# Patient Record
Sex: Male | Born: 1945 | Race: Asian | Hispanic: No | Marital: Married | Smoking: Former smoker
Health system: Southern US, Community
[De-identification: ages and names within clinical notes are randomized; demographics above are authoritative.]

## PROBLEM LIST (undated history)

## (undated) DIAGNOSIS — IMO0002 Reserved for concepts with insufficient information to code with codable children: Secondary | ICD-10-CM

## (undated) DIAGNOSIS — R918 Other nonspecific abnormal finding of lung field: Secondary | ICD-10-CM

## (undated) DIAGNOSIS — IMO0001 Reserved for inherently not codable concepts without codable children: Secondary | ICD-10-CM

## (undated) DIAGNOSIS — C3490 Malignant neoplasm of unspecified part of unspecified bronchus or lung: Secondary | ICD-10-CM

## (undated) DIAGNOSIS — Z720 Tobacco use: Secondary | ICD-10-CM

## (undated) DIAGNOSIS — N400 Enlarged prostate without lower urinary tract symptoms: Secondary | ICD-10-CM

## (undated) DIAGNOSIS — I2510 Atherosclerotic heart disease of native coronary artery without angina pectoris: Secondary | ICD-10-CM

## (undated) HISTORY — DX: Tobacco use: Z72.0

## (undated) HISTORY — DX: Reserved for inherently not codable concepts without codable children: IMO0001

## (undated) HISTORY — DX: Benign prostatic hyperplasia without lower urinary tract symptoms: N40.0

## (undated) HISTORY — DX: Other nonspecific abnormal finding of lung field: R91.8

## (undated) HISTORY — DX: Reserved for concepts with insufficient information to code with codable children: IMO0002

## (undated) HISTORY — DX: Atherosclerotic heart disease of native coronary artery without angina pectoris: I25.10

---

## 2009-09-26 ENCOUNTER — Ambulatory Visit: Payer: Self-pay | Admitting: Internal Medicine

## 2009-09-26 DIAGNOSIS — L259 Unspecified contact dermatitis, unspecified cause: Secondary | ICD-10-CM

## 2009-09-26 DIAGNOSIS — N411 Chronic prostatitis: Secondary | ICD-10-CM

## 2009-09-26 LAB — CONVERTED CEMR LAB
Glucose, Urine, Semiquant: NEGATIVE
Ketones, urine, test strip: NEGATIVE
Nitrite: NEGATIVE
Urobilinogen, UA: 0.2

## 2009-10-27 ENCOUNTER — Ambulatory Visit: Payer: Self-pay | Admitting: Internal Medicine

## 2009-10-27 DIAGNOSIS — R3129 Other microscopic hematuria: Secondary | ICD-10-CM | POA: Insufficient documentation

## 2009-10-27 LAB — CONVERTED CEMR LAB
Bilirubin Urine: NEGATIVE
Ketones, urine, test strip: NEGATIVE
Nitrite: NEGATIVE
Protein, U semiquant: NEGATIVE
Urobilinogen, UA: 0.2
WBC Urine, dipstick: NEGATIVE

## 2009-11-04 ENCOUNTER — Encounter: Payer: Self-pay | Admitting: Internal Medicine

## 2010-11-10 NOTE — Assessment & Plan Note (Signed)
Summary: 1 MONTH FOLLOW UP/MHF   Vital Signs:  Patient profile:   65 year old male Weight:      135 pounds BMI:     23.26 O2 Sat:      100 % on Room air Temp:     97.9 degrees F oral Pulse rate:   67 / minute Pulse rhythm:   regular Resp:     16 per minute BP sitting:   130 / 80  (left arm) Cuff size:   regular  Vitals Entered By: Glendell Docker CMA (October 27, 2009 3:24 PM)  O2 Flow:  Room air  Contraindications/Deferment of Procedures/Staging:    Test/Procedure: FLU VAX    Reason for deferment: patient declined   Primary Care Provider:  DThomos Lemons DO  CC:  1 Month Follow up.  History of Present Illness: 1 Month follow up (Hx as per daughter)  65 y/o [place] male for f/u re:  difficulty urinating / prostatitis.  he finished full course of cipro.  he has been taking hytrin.  urine flow slightly better but he c/o persistent dysuria. no fever, no back pain  pt quit smoking  Preventive Screening-Counseling & Management  Alcohol-Tobacco     Smoking Status: quit < 6 months  Allergies: No Known Drug Allergies  Past History:  Past Medical History: Tobacco use    Family History: Family History of Ulcers     Social History: Occupation: Dry Cleaning Married 34 years 1 son 18   1daughter Smoking Status:  quit < 6 months  Physical Exam  General:  alert, well-developed, and well-nourished.   Lungs:  normal respiratory effort and normal breath sounds.   Heart:  normal rate, regular rhythm, and no gallop.   Abdomen:  soft, non-tender, no masses, no hepatomegaly, and no splenomegaly.   Prostate:  1+ enlarged.  non tender   Impression & Recommendations:  Problem # 1:  MICROSCOPIC HEMATURIA (ICD-599.72) 65 y/o [place] male with long hx of tob use with persistent micro hematuria despite 3 weeks of cipro.  refer to urology to rule out bladder lesion.   The following medications were removed from the medication list:    Ciprofloxacin Hcl 500 Mg Tabs  (Ciprofloxacin hcl) ..... One by mouth two times a day His updated medication list for this problem includes:    Doxycycline Hyclate 100 Mg Tabs (Doxycycline hyclate) ..... One by mouth two times a day  Orders: Urology Referral (Urology)  Problem # 2:  CHRONIC PROSTATITIS (ICD-601.1) still having dysuria.  pt finished cipro.  trial of doxy x 2 weeks.  continue hytrin  Complete Medication List: 1)  Terazosin Hcl 1 Mg Caps (Terazosin hcl) .... One by mouth at bedtime 2)  Doxycycline Hyclate 100 Mg Tabs (Doxycycline hyclate) .... One by mouth two times a day  Other Orders: UA Dipstick w/o Micro (manual) ([id number])  Patient Instructions: 1)  Please schedule a follow-up appointment in 2 months. Prescriptions: TERAZOSIN HCL 1 MG CAPS (TERAZOSIN HCL) one by mouth at bedtime  #30 x 3   Entered and Authorized by:   D. Thomos Lemons DO   Signed by:   D. Thomos Lemons DO on 10/27/2009   Method used:   Print then Give to Patient   RxID:   [id number] DOXYCYCLINE HYCLATE 100 MG TABS (DOXYCYCLINE HYCLATE) one by mouth two times a day  #28 x 0   Entered and Authorized by:   D. Thomos Lemons DO   Signed by:   D. Molly Maduro  Yoo DO on 10/27/2009   Method used:   Print then Give to Patient   RxID:   [id number]   Laboratory Results   Urine Tests    Routine Urinalysis   Color: yellow Appearance: Clear Glucose: negative   (Normal Range: Negative) Bilirubin: negative   (Normal Range: Negative) Ketone: negative   (Normal Range: Negative) Spec. Gravity: 1.010   (Normal Range: 1.003-1.035) Blood: moderate   (Normal Range: Negative) pH: 7.0   (Normal Range: 5.0-8.0) Protein: negative   (Normal Range: Negative) Urobilinogen: 0.2   (Normal Range: 0-1) Nitrite: negative   (Normal Range: Negative) Leukocyte Esterace: negative   (Normal Range: Negative)         Immunization History:  Influenza Immunization History:    Influenza:  declined (10/27/2009)

## 2010-11-10 NOTE — Consult Note (Signed)
Summary: [facility]  [facility]   Imported By: Lanelle Bal 11/13/2009 11:35:51  _____________________________________________________________________  External Attachment:    Type:   Image     Comment:   External Document

## 2012-05-16 ENCOUNTER — Ambulatory Visit (HOSPITAL_BASED_OUTPATIENT_CLINIC_OR_DEPARTMENT_OTHER)
Admission: RE | Admit: 2012-05-16 | Discharge: 2012-05-16 | Disposition: A | Discharge status: Home / Self Care | Payer: No Typology Code available for payment source | Source: Ambulatory Visit | Attending: Internal Medicine | Admitting: Internal Medicine

## 2012-05-16 ENCOUNTER — Ambulatory Visit (INDEPENDENT_AMBULATORY_CARE_PROVIDER_SITE_OTHER): Payer: No Typology Code available for payment source | Admitting: Internal Medicine

## 2012-05-16 ENCOUNTER — Encounter: Payer: Self-pay | Admitting: Internal Medicine

## 2012-05-16 VITALS — BP 124/78 | HR 78 | Temp 98.5°F | Resp 16 | Ht 65.0 in | Wt 131.2 lb

## 2012-05-16 DIAGNOSIS — M25512 Pain in left shoulder: Secondary | ICD-10-CM

## 2012-05-16 DIAGNOSIS — Z720 Tobacco use: Secondary | ICD-10-CM

## 2012-05-16 DIAGNOSIS — R222 Localized swelling, mass and lump, trunk: Secondary | ICD-10-CM

## 2012-05-16 DIAGNOSIS — F17200 Nicotine dependence, unspecified, uncomplicated: Secondary | ICD-10-CM

## 2012-05-16 DIAGNOSIS — M25519 Pain in unspecified shoulder: Secondary | ICD-10-CM | POA: Insufficient documentation

## 2012-05-16 DIAGNOSIS — R918 Other nonspecific abnormal finding of lung field: Secondary | ICD-10-CM

## 2012-05-16 MED ORDER — DICLOFENAC SODIUM 75 MG PO TBEC: DELAYED_RELEASE_TABLET | ORAL | Status: DC

## 2012-05-16 MED ORDER — TERAZOSIN HCL 1 MG PO CAPS: 1.0000 mg | ORAL_CAPSULE | Freq: Every day | ORAL | Status: DC

## 2012-05-17 ENCOUNTER — Encounter (HOSPITAL_BASED_OUTPATIENT_CLINIC_OR_DEPARTMENT_OTHER): Payer: Self-pay

## 2012-05-17 ENCOUNTER — Telehealth: Payer: Self-pay | Admitting: Family

## 2012-05-17 ENCOUNTER — Ambulatory Visit (HOSPITAL_BASED_OUTPATIENT_CLINIC_OR_DEPARTMENT_OTHER)
Admission: RE | Admit: 2012-05-17 | Discharge: 2012-05-17 | Disposition: A | Discharge status: Home / Self Care | Payer: No Typology Code available for payment source | Source: Ambulatory Visit | Attending: Internal Medicine | Admitting: Internal Medicine

## 2012-05-17 DIAGNOSIS — I2510 Atherosclerotic heart disease of native coronary artery without angina pectoris: Secondary | ICD-10-CM | POA: Insufficient documentation

## 2012-05-17 DIAGNOSIS — Z87891 Personal history of nicotine dependence: Secondary | ICD-10-CM | POA: Insufficient documentation

## 2012-05-17 DIAGNOSIS — K7689 Other specified diseases of liver: Secondary | ICD-10-CM | POA: Insufficient documentation

## 2012-05-17 DIAGNOSIS — I700 Atherosclerosis of aorta: Secondary | ICD-10-CM | POA: Insufficient documentation

## 2012-05-17 DIAGNOSIS — R222 Localized swelling, mass and lump, trunk: Secondary | ICD-10-CM | POA: Insufficient documentation

## 2012-05-17 DIAGNOSIS — R918 Other nonspecific abnormal finding of lung field: Secondary | ICD-10-CM | POA: Insufficient documentation

## 2012-05-17 DIAGNOSIS — J984 Other disorders of lung: Secondary | ICD-10-CM | POA: Insufficient documentation

## 2012-05-17 LAB — CBC WITH DIFFERENTIAL/PLATELET
Basophils Absolute: 0.1 10*3/uL (ref 0.0–0.1)
Lymphocytes Relative: 28 % (ref 12–46)
Lymphs Abs: 2.7 10*3/uL (ref 0.7–4.0)
Neutro Abs: 5.9 10*3/uL (ref 1.7–7.7)
Neutrophils Relative %: 62 % (ref 43–77)
Platelets: 276 10*3/uL (ref 150–400)
RBC: 5.39 MIL/uL (ref 4.22–5.81)
RDW: 13.4 % (ref 11.5–15.5)
WBC: 9.4 10*3/uL (ref 4.0–10.5)

## 2012-05-17 LAB — BASIC METABOLIC PANEL
BUN: 14 mg/dL (ref 6–23)
CO2: 28 mEq/L (ref 19–32)
Calcium: 9.9 mg/dL (ref 8.4–10.5)
Chloride: 104 mEq/L (ref 96–112)
Creat: 0.62 mg/dL (ref 0.50–1.35)
Glucose, Bld: 97 mg/dL (ref 70–99)
Potassium: 4 mEq/L (ref 3.5–5.3)
Sodium: 138 mEq/L (ref 135–145)

## 2012-05-17 MED ORDER — IOHEXOL 300 MG/ML  SOLN
80.0000 mL | Freq: Once | INTRAMUSCULAR | Status: AC | PRN
  Administered 2012-05-17: 80 mL via INTRAVENOUS

## 2012-05-17 NOTE — Telephone Encounter (Addendum)
Dr. Michaele Offer- radiology called to discuss pt's CT chest.   CT seems most consistent with infection as nodules are in the upper lungs. Recommends bronchoscopy.  He recommends rml lesion biopsied as this is the most likely lesion to represent malignancy.  He thinks that pt may have infectious process and malignancy.

## 2012-05-19 NOTE — Telephone Encounter (Signed)
LMOM with contact name & number for call back to set appointment for next week for F/U to discuss Imaging results & referral to Pulmonary/SLS

## 2012-05-20 DIAGNOSIS — Z720 Tobacco use: Secondary | ICD-10-CM | POA: Insufficient documentation

## 2012-05-20 DIAGNOSIS — M25512 Pain in left shoulder: Secondary | ICD-10-CM | POA: Insufficient documentation

## 2012-05-20 NOTE — Assessment & Plan Note (Signed)
Obtain plain xray of shoulder. Attempt voltaren with food and no other nsaid. Orthopedic referral if no improvement

## 2012-05-20 NOTE — Progress Notes (Signed)
  Subjective:    Patient ID: Thomas May, male    DOB: [birth year], 65 y.o.   MRN: [id number]  HPI  Pt presents to clinic for evaluation of left shoulder pain. Notes 3 month h/o left shoulder pain without injury or trauma. Has decreased ROM specifically abduction. Tried acupuncture without improvement. Also tried exercising with 7.5lb weights. No other alleviating or exacerbating factors. Has longstanding tobacco use. Denies cough, hemoptysis, sweats or chest pain. No other complaints.  Past Medical History  Diagnosis Date  . Tobacco use    No past surgical history on file.  reports that he has been smoking Cigarettes.  He does not have any smokeless tobacco history on file. His alcohol and drug histories not on file. family history includes Ulcers in his other. No Known Allergies   Review of Systems see hpi     Objective:   Physical Exam  Nursing note and vitals reviewed. Constitutional: He appears well-developed and well-nourished. No distress.  HENT:  Head: Normocephalic and atraumatic.  Eyes: Conjunctivae are normal. No scleral icterus.  Neck: Neck supple.  Cardiovascular: Normal rate, regular rhythm and normal heart sounds.  Exam reveals no gallop and no friction rub.   No murmur heard. Pulmonary/Chest: Effort normal and breath sounds normal. No respiratory distress. He has no wheezes. He has no rales.  Musculoskeletal:       Left shoulder: NT. No bony abn. Decreased ROM/abduction.  Neurological: He is alert.  Skin: Skin is warm and dry. He is not diaphoretic.  Psychiatric: He has a normal mood and affect.          Assessment & Plan:

## 2012-05-20 NOTE — Assessment & Plan Note (Signed)
Obtain cxr. Discussed and recommend cessation.

## 2012-05-22 NOTE — Telephone Encounter (Signed)
Discuss with patient daughter Almira Coaster Appt schedule to further discuss results.       Notes Recorded by Regis Bill, CMA on 05/19/2012 at 5:09 PM [facility] with contact name & number for call back to set appointment for next week for F/U to discuss Imaging results & referral to Pulmonary/SLS ------  Notes Recorded by Edwyna Perfect, MD on 05/17/2012 at 4:33 PM pls see if he can come in tomorrow or Friday to discuss chest ct findings. There is now a greater chance that what we saw on CXR is an infection. Still can't exclude cancer but it seems less likely. Will talk with him about seeing a lung specialist. Will review ct findings at appt

## 2012-05-23 ENCOUNTER — Ambulatory Visit (INDEPENDENT_AMBULATORY_CARE_PROVIDER_SITE_OTHER): Payer: No Typology Code available for payment source | Admitting: Internal Medicine

## 2012-05-23 ENCOUNTER — Encounter: Payer: Self-pay | Admitting: Internal Medicine

## 2012-05-23 VITALS — BP 128/84 | HR 68 | Temp 97.4°F | Resp 14 | Wt 135.8 lb

## 2012-05-23 DIAGNOSIS — R918 Other nonspecific abnormal finding of lung field: Secondary | ICD-10-CM

## 2012-05-23 DIAGNOSIS — I2510 Atherosclerotic heart disease of native coronary artery without angina pectoris: Secondary | ICD-10-CM

## 2012-05-23 DIAGNOSIS — R222 Localized swelling, mass and lump, trunk: Secondary | ICD-10-CM

## 2012-05-23 DIAGNOSIS — M25519 Pain in unspecified shoulder: Secondary | ICD-10-CM

## 2012-05-23 DIAGNOSIS — N400 Enlarged prostate without lower urinary tract symptoms: Secondary | ICD-10-CM

## 2012-05-23 DIAGNOSIS — M25512 Pain in left shoulder: Secondary | ICD-10-CM

## 2012-05-23 NOTE — Assessment & Plan Note (Signed)
Just resumed terazosin with some initial improvement of sx's. At f/u visit obtain PSA.

## 2012-05-23 NOTE — Assessment & Plan Note (Signed)
Differential diagnosis still includes atypical infxn and malignancy. Recommend pulmonary consult ?bronchoscopy.

## 2012-05-23 NOTE — Assessment & Plan Note (Signed)
Asx. Long standing tobacco use. Cardiology consult

## 2012-05-23 NOTE — Progress Notes (Signed)
  Subjective:    Patient ID: Thomas May, male    DOB: 10/17/1945, 66 y.o.   MRN: [id number]  HPI Pt presents to clinic for followup of multiple medical problems. Hx obtained with assistance of daughter and foreign language translator who participate with his permission. Initial visit related to chronic left shoulder pain (initially thought to be 3 months duration now believed to be several years). No reported injury/trauma but does vigorous work. Has pain and diminished ROM. Failing nsaid.   During evaluation CXR obtained followed by chest CT. CT reviewed with pt showing multiple pulmonary nodules/masses bilateral lungs with associated right hilar adenopathy, cavitation and air bronchograms. No known h/o TB or fungal infection and denies dyspnea, cough, sweats, hemoptysis. There also exists a larger lesion of RML with spiculated margins that appears more aggressive. Does have long standing tobacco hx.   Reviewed incidental finding of left main, LAD, left cx and RCA calcification. No known h/o CAD and denies chest pain or pressure at rest or exertion.  Known h/o BPH s/p urology evaluation several years ago. Previously took alpha blocker then self dc'ed. Just resumed last week and notes improvement of nocturia from 5+/night to ~3/night.   Past Medical History  Diagnosis Date  . Tobacco use    No past surgical history on file.  reports that he has quit smoking. His smoking use included Cigarettes. He does not have any smokeless tobacco history on file. His alcohol and drug histories not on file. family history includes Ulcers in his other. No Known Allergies    Review of Systems see hpi     Objective:   Physical Exam  Nursing note and vitals reviewed. Constitutional: He appears well-developed and well-nourished. No distress.  HENT:  Head: Normocephalic and atraumatic.  Neurological: He is alert.  Skin: He is not diaphoretic.  Psychiatric: He has a normal mood and affect.            Assessment & Plan:

## 2012-05-23 NOTE — Assessment & Plan Note (Signed)
Failing nsaids. Orthopedic consult

## 2012-05-25 ENCOUNTER — Encounter: Payer: Self-pay | Admitting: Internal Medicine

## 2012-05-25 ENCOUNTER — Ambulatory Visit (INDEPENDENT_AMBULATORY_CARE_PROVIDER_SITE_OTHER): Payer: No Typology Code available for payment source | Admitting: Internal Medicine

## 2012-05-25 ENCOUNTER — Other Ambulatory Visit (INDEPENDENT_AMBULATORY_CARE_PROVIDER_SITE_OTHER): Payer: No Typology Code available for payment source

## 2012-05-25 VITALS — BP 158/82 | HR 64 | Temp 98.7°F | Ht 66.0 in | Wt 137.0 lb

## 2012-05-25 DIAGNOSIS — R918 Other nonspecific abnormal finding of lung field: Secondary | ICD-10-CM

## 2012-05-25 DIAGNOSIS — R222 Localized swelling, mass and lump, trunk: Secondary | ICD-10-CM

## 2012-05-25 LAB — URINALYSIS, ROUTINE W REFLEX MICROSCOPIC
Hgb urine dipstick: NEGATIVE
Ketones, ur: NEGATIVE
Specific Gravity, Urine: 1.015 (ref 1.000–1.030)
Urine Glucose: NEGATIVE
Urobilinogen, UA: 0.2 (ref 0.0–1.0)

## 2012-05-25 LAB — SEDIMENTATION RATE: Sed Rate: 32 mm/hr — ABNORMAL HIGH (ref 0–22)

## 2012-05-25 NOTE — Progress Notes (Signed)
  Subjective:    Patient ID: Thomas May, male    DOB: October 30, 1945  MRN: [id number]  HPI  66 yo [place] quit smoking 05/2102 with new onset L shoulder pain dx with MPN with cavitation by Dr Rodena Medin and referred to pulmonary clinic 05/25/2012    05/25/2012 1st pulmonary ov cc indolent onset progressively worsening new L shoulder pain and wt loss x 10 lb x 3 month,   Pain esp p lie down but no pleuritic component.  No sob.  No unusual cough, purulent sputum or sinus/hb symptoms on present rx. No other joint complaints.   Sleeping ok without nocturnal  or early am exacerbation  of respiratory  c/o's or need for noct saba. Also denies any obvious fluctuation of symptoms with weather or environmental changes or other aggravating or alleviating factors except as outlined above   Review of Systems  Constitutional: Positive for unexpected weight change. Negative for fever.  HENT: Negative for ear pain, nosebleeds, congestion, sore throat, rhinorrhea, sneezing, trouble swallowing, dental problem, postnasal drip and sinus pressure.   Eyes: Negative for redness and itching.  Respiratory: Negative for cough, chest tightness, shortness of breath and wheezing.   Cardiovascular: Negative for palpitations and leg swelling.  Gastrointestinal: Negative for nausea and vomiting.  Genitourinary: Negative for dysuria.  Musculoskeletal: Negative for joint swelling.  Skin: Negative for rash.  Neurological: Negative for headaches.  Hematological: Does not bruise/bleed easily.  Psychiatric/Behavioral: Negative for dysphoric mood. The patient is not nervous/anxious.        Objective:   Physical Exam  Stoic amb [place] male nad Wt Readings from Last 3 Encounters:  05/25/12 137 lb (62.143 kg)  05/23/12 135 lb 12 oz (61.576 kg)  05/16/12 131 lb 4 oz (59.535 kg)    HEENT: nl dentition, turbinates, and orophanx. Nl external ear canals without cough reflex   NECK :  without JVD/Nodes/TM/ nl carotid upstrokes  bilaterally   LUNGS: no acc muscle use, clear to A and P bilaterally without cough on insp or exp maneuvers   CV:  RRR  no s3 or murmur or increase in P2, no edema   ABD:  soft and nontender with nl excursion in the supine position. No bruits or organomegaly, bowel sounds nl  MS:  warm without deformities, calf tenderness, cyanosis or clubbing. Limited ext, ext and abd L shoulder due to pain on manipulation  SKIN: warm and dry without lesions    NEURO:  alert, approp, no deficits    05/16/12 CT chest (omitted L shoulder views) 1. While there are numerous pulmonary nodules and masses scattered  throughout the lungs bilaterally, the appearance is highly unusual.  Many of the nodules have central areas of cavitation, and several  have air bronchograms. These features are suggestive of an  infectious process, with differential considerations including both  fungal and atypical organisms (including nocardial and  mycobacterial organisms). Although metastatic disease remains a  possibility, the strong upper lung predominance of these nodules  and their imaging appearance makes metastatic disease can less  favored in the differential diagnosis.  2. However, the largest lesion in the right middle lobe, a 5.8 x  4.0 cm macrolobulated mass with spiculated margins, has a highly  aggressive appearance. This could be part of the same (presumably  infectious) process, or could represent a separate bronchogenic  neoplasm.      Assessment & Plan:

## 2012-05-25 NOTE — Assessment & Plan Note (Signed)
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  In meantime screen for Wegener's with ANCA titers.  No evidence clinically of opportunistic infection, septic emboli or other causes of mutiple lung cavities.

## 2012-05-25 NOTE — Patient Instructions (Addendum)
Please remember to go to the lab  department downstairs for your tests - we will call you with the results when they are available.  Tentatively schedule bronchoscopy 8/21 at [facility] behind  the ER @ 1230 am and nothing to eat after midnight Tuesday except a little juice before 8 am 8/21

## 2012-05-26 ENCOUNTER — Telehealth: Payer: Self-pay | Admitting: Internal Medicine

## 2012-05-26 LAB — ANCA SCREEN W REFLEX TITER
Atypical p-ANCA Screen: NEGATIVE
p-ANCA Screen: NEGATIVE

## 2012-05-26 NOTE — Telephone Encounter (Signed)
Patients Son returned call- u/a results given to son d/t patient not speaking good english. Labs and recs per MW given to patient. Expressed understanding.

## 2012-05-29 NOTE — Progress Notes (Signed)
Quick Note:  Spoke with pt and notified of results per Dr. Wert. Pt verbalized understanding and denied any questions.  ______ 

## 2012-05-31 ENCOUNTER — Ambulatory Visit (HOSPITAL_COMMUNITY)
Admission: RE | Admit: 2012-05-31 | Discharge: 2012-05-31 | Disposition: A | Discharge status: Home / Self Care | Payer: No Typology Code available for payment source | Source: Ambulatory Visit | Attending: Internal Medicine | Admitting: Internal Medicine

## 2012-05-31 ENCOUNTER — Ambulatory Visit (HOSPITAL_COMMUNITY): Payer: No Typology Code available for payment source

## 2012-05-31 ENCOUNTER — Encounter (HOSPITAL_COMMUNITY)
Admission: RE | Disposition: A | Discharge status: Home / Self Care | Payer: Self-pay | Source: Ambulatory Visit | Attending: Internal Medicine

## 2012-05-31 ENCOUNTER — Encounter (HOSPITAL_COMMUNITY): Payer: No Typology Code available for payment source

## 2012-05-31 ENCOUNTER — Encounter (HOSPITAL_COMMUNITY): Payer: Self-pay | Admitting: Radiology

## 2012-05-31 DIAGNOSIS — Z87891 Personal history of nicotine dependence: Secondary | ICD-10-CM | POA: Insufficient documentation

## 2012-05-31 DIAGNOSIS — R918 Other nonspecific abnormal finding of lung field: Secondary | ICD-10-CM

## 2012-05-31 DIAGNOSIS — C342 Malignant neoplasm of middle lobe, bronchus or lung: Secondary | ICD-10-CM | POA: Insufficient documentation

## 2012-05-31 DIAGNOSIS — R222 Localized swelling, mass and lump, trunk: Secondary | ICD-10-CM

## 2012-05-31 HISTORY — PX: VIDEO BRONCHOSCOPY: SHX5072

## 2012-05-31 SURGERY — BRONCHOSCOPY, WITH FLUOROSCOPY
Anesthesia: Moderate Sedation | Laterality: Bilateral

## 2012-05-31 MED ORDER — LIDOCAINE HCL 2 % EX GEL
CUTANEOUS | Status: DC | PRN
  Administered 2012-05-31: 1

## 2012-05-31 MED ORDER — MIDAZOLAM HCL 2 MG/2ML IJ SOLN
1.0000 mg | Freq: Once | INTRAMUSCULAR | Status: DC
  Filled 2012-05-31: qty 10

## 2012-05-31 MED ORDER — MEPERIDINE HCL 25 MG/ML IJ SOLN
INTRAMUSCULAR | Status: DC | PRN
  Administered 2012-05-31: 50 mg via INTRAVENOUS

## 2012-05-31 MED ORDER — LIDOCAINE HCL 1 % IJ SOLN
INTRAMUSCULAR | Status: DC | PRN
  Administered 2012-05-31: 5 mL via RESPIRATORY_TRACT

## 2012-05-31 MED ORDER — MIDAZOLAM HCL 10 MG/2ML IJ SOLN
INTRAMUSCULAR | Status: AC
  Filled 2012-05-31: qty 4

## 2012-05-31 MED ORDER — SODIUM CHLORIDE 0.9 % IV SOLN: INTRAVENOUS | Status: DC

## 2012-05-31 MED ORDER — MEPERIDINE HCL 100 MG/ML IJ SOLN
INTRAMUSCULAR | Status: AC
  Filled 2012-05-31: qty 2

## 2012-05-31 MED ORDER — PHENYLEPHRINE HCL 0.5 % NA SOLN
NASAL | Status: DC | PRN
  Administered 2012-05-31: 2 [drp] via NASAL

## 2012-05-31 MED ORDER — MEPERIDINE HCL 100 MG/ML IJ SOLN: 100.0000 mg | Freq: Once | INTRAMUSCULAR | Status: DC

## 2012-05-31 MED ORDER — PHENYLEPHRINE HCL 10 MG/ML IJ SOLN: INTRAMUSCULAR | Status: DC | PRN

## 2012-05-31 MED ORDER — MIDAZOLAM HCL 10 MG/2ML IJ SOLN
INTRAMUSCULAR | Status: DC | PRN
  Administered 2012-05-31: 5 mg via INTRAVENOUS

## 2012-05-31 NOTE — Progress Notes (Signed)
Bronchoscopy performed with intervention BAL and intervention Biopsy 

## 2012-05-31 NOTE — Op Note (Signed)
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  The risks, benefits, complications, treatment options and expected outcomes were discussed with the patient.  The possibilities of reaction to medication, pulmonary aspiration, perforation of a viscus, bleeding, failure to diagnose a condition and creating a complication requiring transfusion or operation were discussed with the patient who freely signed the consent.    Description of Procedure: The patient was re-examined in the bronchoscopy suite and the site of surgery properly noted/marked.  The patient was identified  and the procedure verified as Flexible Video Fiberoptic Bronchoscopy.  A Time Out was held and the above information confirmed.   After the induction of topical nasopharyngeal anesthesia, the patient was positioned  and the bronchoscope was passed through the R naris. The vocal cords were visualized and  1% buffered lidocaine 5 ml was topically placed onto the cords. The cords were nl. The scope was then passed into the trachea.  1% buffered lidocaine given topically. Airways inspected bilaterally to the subsegmental level with the following findings:  All airways opened widely to the subsegmental level with no lesions seen.  Procedure:  RML lavage x 60 cc slt pinkish return  RML Tbbx wedge position x 3 by fluoroscopy, min bleeding p 3rd so stopped procedure at that point with adequate return.    F/u cxr pending    Attestation: I performed the procedure.  Sandrea Hughs, MD Pulmonary and  Critical Care Medicine [facility] Cell [phone]

## 2012-05-31 NOTE — H&P (Signed)
66 [place] quit smoking 05/2102 with new onset L shoulder pain dx with MPN with cavitation by Dr Rodena Medin and referred to pulmonary clinic 05/25/2012   05/25/2012 1st pulmonary ov cc indolent onset progressively worsening new L shoulder pain and wt loss x 10 lb x 3 month, Pain esp p lie down but no pleuritic component. No sob. No unusual cough, purulent sputum or sinus/hb symptoms on present rx. No other joint complaints.    Sleeping ok without nocturnal or early am exacerbation of respiratory c/o's or need for noct saba. Also denies any obvious fluctuation of symptoms with weather or environmental changes or other aggravating or alleviating factors except as outlined above   Review of Systems  Constitutional: Positive for unexpected weight change. Negative for fever.  HENT: Negative for ear pain, nosebleeds, congestion, sore throat, rhinorrhea, sneezing, trouble swallowing, dental problem, postnasal drip and sinus pressure.  Eyes: Negative for redness and itching.  Respiratory: Negative for cough, chest tightness, shortness of breath and wheezing.  Cardiovascular: Negative for palpitations and leg swelling.  Gastrointestinal: Negative for nausea and vomiting.  Genitourinary: Negative for dysuria.  Musculoskeletal: Negative for joint swelling.  Skin: Negative for rash.  Neurological: Negative for headaches.  Hematological: Does not bruise/bleed easily.  Psychiatric/Behavioral: Negative for dysphoric mood. The patient is not nervous/anxious.    Objective:   Physical Exam  Stoic amb k[place] male nad  Wt Readings from Last 3 Encounters:   05/25/12  137 lb (62.143 kg)   05/23/12  135 lb 12 oz (61.576 kg)   05/16/12  131 lb 4 oz (59.535 kg)    HEENT: nl dentition, turbinates, and orophanx. Nl external ear canals without cough reflex  NECK : without JVD/Nodes/TM/ nl carotid upstrokes bilaterally  LUNGS: no acc muscle use, clear to A and P bilaterally without cough on insp or exp  maneuvers  CV: RRR no s3 or murmur or increase in P2, no edema  ABD: soft and nontender with nl excursion in the supine position. No bruits or organomegaly, bowel sounds nl  MS: warm without deformities, calf tenderness, cyanosis or clubbing. Limited ext, ext and abd L shoulder due to pain on manipulation  SKIN: warm and dry without lesions  NEURO: alert, approp, no deficits   05/16/12 CT chest (omitted L shoulder views)  1. While there are numerous pulmonary nodules and masses scattered  throughout the lungs bilaterally, the appearance is highly unusual.  Many of the nodules have central areas of cavitation, and several  have air bronchograms. These features are suggestive of an  infectious process, with differential considerations including both  fungal and atypical organisms (including nocardial and  mycobacterial organisms). Although metastatic disease remains a  possibility, the strong upper lung predominance of these nodules  and their imaging appearance makes metastatic disease can less  favored in the differential diagnosis.  2. However, the largest lesion in the right middle lobe, a 5.8 x  4.0 cm macrolobulated mass with spiculated margins, has a highly  aggressive appearance. This could be part of the same (presumably  infectious) process, or could represent a separate bronchogenic  neoplasm.   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 In meantime screen for Wegener's with ANCA titers.  No evidence  clinically of opportunistic infection, septic emboli or other causes of mutiple lung cavities.

## 2012-06-01 ENCOUNTER — Encounter (HOSPITAL_COMMUNITY): Payer: Self-pay

## 2012-06-01 ENCOUNTER — Encounter (HOSPITAL_COMMUNITY): Payer: Self-pay | Admitting: Internal Medicine

## 2012-06-02 ENCOUNTER — Ambulatory Visit (INDEPENDENT_AMBULATORY_CARE_PROVIDER_SITE_OTHER): Payer: No Typology Code available for payment source | Admitting: Cardiovascular Disease

## 2012-06-02 ENCOUNTER — Encounter: Payer: Self-pay | Admitting: Cardiovascular Disease

## 2012-06-02 ENCOUNTER — Encounter: Payer: Self-pay | Admitting: *Deleted

## 2012-06-02 VITALS — BP 149/87 | HR 69 | Ht 65.0 in | Wt 133.0 lb

## 2012-06-02 DIAGNOSIS — I2510 Atherosclerotic heart disease of native coronary artery without angina pectoris: Secondary | ICD-10-CM

## 2012-06-02 LAB — CBC WITH DIFFERENTIAL/PLATELET
Basophils Relative: 1 % (ref 0–1)
Eosinophils Absolute: 0.5 10*3/uL (ref 0.0–0.7)
Eosinophils Relative: 5 % (ref 0–5)
Hemoglobin: 14.9 g/dL (ref 13.0–17.0)
MCH: 30.1 pg (ref 26.0–34.0)
MCHC: 34 g/dL (ref 30.0–36.0)
MCV: 88.5 fL (ref 78.0–100.0)
Monocytes Relative: 6 % (ref 3–12)
Neutrophils Relative %: 64 % (ref 43–77)

## 2012-06-02 LAB — BASIC METABOLIC PANEL
BUN: 13 mg/dL (ref 6–23)
CO2: 24 mEq/L (ref 19–32)
Calcium: 9.6 mg/dL (ref 8.4–10.5)
Creat: 0.65 mg/dL (ref 0.50–1.35)
Glucose, Bld: 107 mg/dL — ABNORMAL HIGH (ref 70–99)
Sodium: 141 mEq/L (ref 135–145)

## 2012-06-02 LAB — PROTIME-INR: Prothrombin Time: 14 seconds (ref 11.6–15.2)

## 2012-06-02 LAB — CULTURE, RESPIRATORY W GRAM STAIN: Special Requests: NORMAL

## 2012-06-02 NOTE — Patient Instructions (Addendum)
Your physician recommends that you schedule a follow-up appointment in:  4-5 weeks.   Your physician has requested that you have a cardiac catheterization. Cardiac catheterization is used to diagnose and/or treat various heart conditions. Doctors may recommend this procedure for a number of different reasons. The most common reason is to evaluate chest pain. Chest pain can be a symptom of coronary artery disease (CAD), and cardiac catheterization can show whether plaque is narrowing or blocking your heart's arteries. This procedure is also used to evaluate the valves, as well as measure the blood flow and oxygen levels in different parts of your heart. For further information please visit [email or web address] Please follow instruction sheet, as given. Scheduled for June 05, 2012

## 2012-06-02 NOTE — Progress Notes (Signed)
History of Present Illness: 66 yo male from [place] with history of long standing tobacco abuse, recent discovery of right lung mass likely representing metastatic cancer. He had a CT scan of his chest which showed the incidental finding of calcification of all of his coronary arteries. The CT scan was focused on his lung disease and was not a cardiac CTA. He has seen Dr. Sherene Sires with [facility] and a lung biopsy is planned. His comlaint leading to this workup was left shoulder pain.   He has had no chest pain or dyspnea. He stopped smoking this month.   Primary Care Physician: Charlynn Court   Past Medical History  Diagnosis Date  . Tobacco use     Past Surgical History  Procedure Date  . Video bronchoscopy 05/31/2012    Procedure: VIDEO BRONCHOSCOPY WITH FLUORO;  Surgeon: Nyoka Cowden, MD;  Location: [facility];  Service: Endoscopy;  Laterality: Bilateral;    Current Outpatient Prescriptions  Medication Sig Dispense Refill  . Multiple Vitamin (MULTIVITAMIN) tablet Take 1 tablet by mouth daily.      . Saw Palmetto, Serenoa repens, (SAW PALMETTO PO) Take 1 capsule by mouth daily.      Marland Kitchen terazosin (HYTRIN) 1 MG capsule Take 1 capsule (1 mg total) by mouth at bedtime.  30 capsule  5   No current facility-administered medications for this visit.   Facility-Administered Medications Ordered in Other Visits  Medication Dose Route Frequency Provider Last Rate Last Dose  . DISCONTD: 0.9 %  sodium chloride infusion   Intravenous Continuous Nyoka Cowden, MD      . DISCONTD: meperidine (DEMEROL) injection 100 mg  100 mg Intravenous Once Nyoka Cowden, MD      . DISCONTD: midazolam (VERSED) injection 1-10 mg  1-10 mg Intravenous Once Nyoka Cowden, MD        No Known Allergies  History   Social History  . Marital Status: Married    Spouse Name: N/A    Number of Children: 2  . Years of Education: N/A   Occupational History  . Dry Cleaner Owner    Social History Main  Topics  . Smoking status: Former Smoker -- 0.5 packs/day for 40 years    Types: Cigarettes    Quit date: 05/11/2012  . Smokeless tobacco: Never Used   Comment: Patient is using Nicotine patches.  . Alcohol Use: No     three times weekly  . Drug Use: Not on file  . Sexually Active: Not on file   Other Topics Concern  . Not on file   Social History Narrative   Occupation: Dry CleaningMarried 34 years1 son1 daughter    Family History  Problem Relation Age of Onset  . Ulcers Other     Review of Systems:  As stated in the HPI and otherwise negative.   There were no vitals taken for this visit.  Physical Examination: General: Well developed, well nourished, NAD HEENT: OP clear, mucus membranes moist SKIN: warm, dry. No rashes. Neuro: No focal deficits Musculoskeletal: Muscle strength 5/5 all ext Psychiatric: Mood and affect normal Neck: No JVD, no carotid bruits, no thyromegaly, no lymphadenopathy. Lungs:Clear bilaterally, no wheezes, rhonci, crackles Cardiovascular: Regular rate and rhythm. No murmurs, gallops or rubs. Abdomen:Soft. Bowel sounds present. Non-tender.  Extremities: No lower extremity edema. Pulses are 2 + in the bilateral DP/PT.  EKG: NSR, rate 67 bpm. Incomplete RBBB.   Assessment and Plan:   1. CAD: He has no chest  pain but he does have calcification of all coronaries on CT chest. He is going to need major surgery for his lung mass. I have discussed definitive assessment of coronaries with left heart cath and he agrees to proceed. Will arrange cath for 06/05/12 at 1pm in outpt cath lab. Pre-cath labs today. R/B reviewed.

## 2012-06-05 ENCOUNTER — Telehealth: Payer: Self-pay | Admitting: Internal Medicine

## 2012-06-05 ENCOUNTER — Encounter (HOSPITAL_BASED_OUTPATIENT_CLINIC_OR_DEPARTMENT_OTHER)
Admission: RE | Disposition: A | Discharge status: Home / Self Care | Payer: Self-pay | Source: Ambulatory Visit | Attending: Cardiovascular Disease

## 2012-06-05 ENCOUNTER — Inpatient Hospital Stay (HOSPITAL_BASED_OUTPATIENT_CLINIC_OR_DEPARTMENT_OTHER)
Admission: RE | Admit: 2012-06-05 | Discharge: 2012-06-05 | Disposition: A | Discharge status: Home / Self Care | Payer: No Typology Code available for payment source | Source: Ambulatory Visit | Attending: Cardiovascular Disease | Admitting: Cardiovascular Disease

## 2012-06-05 DIAGNOSIS — I2510 Atherosclerotic heart disease of native coronary artery without angina pectoris: Secondary | ICD-10-CM

## 2012-06-05 DIAGNOSIS — R918 Other nonspecific abnormal finding of lung field: Secondary | ICD-10-CM

## 2012-06-05 DIAGNOSIS — R222 Localized swelling, mass and lump, trunk: Secondary | ICD-10-CM | POA: Insufficient documentation

## 2012-06-05 DIAGNOSIS — F17200 Nicotine dependence, unspecified, uncomplicated: Secondary | ICD-10-CM | POA: Insufficient documentation

## 2012-06-05 SURGERY — JV LEFT HEART CATHETERIZATION WITH CORONARY ANGIOGRAM
Anesthesia: Moderate Sedation

## 2012-06-05 MED ORDER — SODIUM CHLORIDE 0.9 % IV SOLN: INTRAVENOUS | Status: DC

## 2012-06-05 MED ORDER — DIAZEPAM 5 MG PO TABS
5.0000 mg | ORAL_TABLET | ORAL | Status: AC
  Administered 2012-06-05: 5 mg via ORAL

## 2012-06-05 MED ORDER — SODIUM CHLORIDE 0.9 % IV SOLN: INTRAVENOUS | Status: AC

## 2012-06-05 MED ORDER — SODIUM CHLORIDE 0.9 % IJ SOLN: 3.0000 mL | Freq: Two times a day (BID) | INTRAMUSCULAR | Status: DC

## 2012-06-05 MED ORDER — SODIUM CHLORIDE 0.9 % IV SOLN: 250.0000 mL | INTRAVENOUS | Status: DC | PRN

## 2012-06-05 MED ORDER — ASPIRIN 81 MG PO CHEW
324.0000 mg | CHEWABLE_TABLET | ORAL | Status: AC
  Administered 2012-06-05: 324 mg via ORAL

## 2012-06-05 MED ORDER — ACETAMINOPHEN 325 MG PO TABS: 650.0000 mg | ORAL_TABLET | ORAL | Status: DC | PRN

## 2012-06-05 MED ORDER — SODIUM CHLORIDE 0.9 % IJ SOLN: 3.0000 mL | INTRAMUSCULAR | Status: DC | PRN

## 2012-06-05 NOTE — Telephone Encounter (Signed)
Not clear why but still pending in computer - please call [facility] pathology and check

## 2012-06-05 NOTE — CV Procedure (Signed)
   Cardiac Catheterization Operative Report  Thomas May [id number] 8/26/20132:36 PM Hodgin Karene Fry, MD  Procedure Performed:  1. Left Heart Catheterization 2. Selective Coronary Angiography 3. Left ventricular angiogram  Operator: Verne Carrow, MD  Indication: CAD demonstrated on CT chest, severe calcification in all coronaries. Pt has large lung mass, likely cancer and may need surgical resection. Cardiac cath today to define degree of CAD for risk stratification.                                       Procedure Details: The risks, benefits, complications, treatment options, and expected outcomes were discussed with the patient. The patient and/or family concurred with the proposed plan, giving informed consent. The patient was brought to the outpatient cath lab after IV hydration was begun and oral premedication was given. The patient was further sedated with Versed and Fentanyl. The right groin was prepped and draped in the usual manner. Using the modified Seldinger access technique, a 4 French sheath was placed in the right femoral artery. Standard diagnostic catheters were used to perform selective coronary angiography. A pigtail catheter was used to perform a left ventricular angiogram.  There were no immediate complications. The patient was taken to the recovery area in stable condition.   Hemodynamic Findings: Central aortic pressure: 132/73 Left ventricular pressure: 131/12/11  Angiographic Findings:  Left main: No obstructive disease noted.   Left Anterior Descending Artery: Large caliber vessel that courses to the apex. The mid vessel has a long, eccentric 65% stenosis. The distal vessel has mild luminal irregularities. The first diagonal branch is small in caliber and has diffuse 30% stenosis.   Circumflex Artery: Large, dominant vessel with mild calcification and plaque in the mid vessel. The first obtuse marginal branch is very small and has no disease.  The second marginal branch is moderate sized and has no disease noted.   Right Coronary Artery: Small, non-dominant vessel with diffuse, mild plaque.   Left Ventricular Angiogram: LVEF=55%.   Impression: 1. Moderate, non-obstructive CAD 2. Preserved LV systolic function 3. No symptoms c/w unstable angina 4. Lung mass, workup pending.   Recommendations: Will start statin, ASA and beta blocker. No further ischemic testing prior to any planned surgical therapy for lung mass.        Complications:  None. The patient tolerated the procedure well.

## 2012-06-05 NOTE — Telephone Encounter (Signed)
Pt son is requesting results of Bronch done on 05-31-12. Please advise if you have any results yet. Carron Curie, CMA

## 2012-06-05 NOTE — OR Nursing (Signed)
Tegaderm dressing applied, site level 0, bedrest begins at 1450

## 2012-06-05 NOTE — Telephone Encounter (Signed)
I spoke with pathology and was advised that the doctor complete the results about 5 minutes ago, so results should show in Epic in another 10-15 mins and then they can be reviewed. MD advised to recheck system at that time. Carron Curie, CMA

## 2012-06-05 NOTE — OR Nursing (Signed)
Discharge instructions reviewed and signed, pt stated understanding, interaction facilitated through interpreter, ambulated in hall without difficulty, site level 0, transported to daughter's car via wheelchair

## 2012-06-05 NOTE — H&P (View-Only) (Signed)
 History of Present Illness: 66 yo male from [place] with history of long standing tobacco abuse, recent discovery of right lung mass likely representing metastatic cancer. He had a CT scan of his chest which showed the incidental finding of calcification of all of his coronary arteries. The CT scan was focused on his lung disease and was not a cardiac CTA. He has seen Dr. Wert with [facility] and a lung biopsy is planned. His comlaint leading to this workup was left shoulder pain.   He has had no chest pain or dyspnea. He stopped smoking this month.   Primary Care Physician: Thomas Hodgin   Past Medical History  Diagnosis Date  . Tobacco use     Past Surgical History  Procedure Date  . Video bronchoscopy 05/31/2012    Procedure: VIDEO BRONCHOSCOPY WITH FLUORO;  Surgeon: Michael B Wert, MD;  Location: [facility];  Service: Endoscopy;  Laterality: Bilateral;    Current Outpatient Prescriptions  Medication Sig Dispense Refill  . Multiple Vitamin (MULTIVITAMIN) tablet Take 1 tablet by mouth daily.      . Saw Palmetto, Serenoa repens, (SAW PALMETTO PO) Take 1 capsule by mouth daily.      . terazosin (HYTRIN) 1 MG capsule Take 1 capsule (1 mg total) by mouth at bedtime.  30 capsule  5   No current facility-administered medications for this visit.   Facility-Administered Medications Ordered in Other Visits  Medication Dose Route Frequency Provider Last Rate Last Dose  . DISCONTD: 0.9 %  sodium chloride infusion   Intravenous Continuous Michael B Wert, MD      . DISCONTD: meperidine (DEMEROL) injection 100 mg  100 mg Intravenous Once Michael B Wert, MD      . DISCONTD: midazolam (VERSED) injection 1-10 mg  1-10 mg Intravenous Once Michael B Wert, MD        No Known Allergies  History   Social History  . Marital Status: Married    Spouse Name: N/A    Number of Children: 2  . Years of Education: N/A   Occupational History  . Dry Cleaner Owner    Social History Main  Topics  . Smoking status: Former Smoker -- 0.5 packs/day for 40 years    Types: Cigarettes    Quit date: 05/11/2012  . Smokeless tobacco: Never Used   Comment: Patient is using Nicotine patches.  . Alcohol Use: No     three times weekly  . Drug Use: Not on file  . Sexually Active: Not on file   Other Topics Concern  . Not on file   Social History Narrative   Occupation: Dry CleaningMarried 34 years1 son1 daughter    Family History  Problem Relation Age of Onset  . Ulcers Other     Review of Systems:  As stated in the HPI and otherwise negative.   There were no vitals taken for this visit.  Physical Examination: General: Well developed, well nourished, NAD HEENT: OP clear, mucus membranes moist SKIN: warm, dry. No rashes. Neuro: No focal deficits Musculoskeletal: Muscle strength 5/5 all ext Psychiatric: Mood and affect normal Neck: No JVD, no carotid bruits, no thyromegaly, no lymphadenopathy. Lungs:Clear bilaterally, no wheezes, rhonci, crackles Cardiovascular: Regular rate and rhythm. No murmurs, gallops or rubs. Abdomen:Soft. Bowel sounds present. Non-tender.  Extremities: No lower extremity edema. Pulses are 2 + in the bilateral DP/PT.  EKG: NSR, rate 67 bpm. Incomplete RBBB.   Assessment and Plan:   1. CAD: He has no chest   pain but he does have calcification of all coronaries on CT chest. He is going to need major surgery for his lung mass. I have discussed definitive assessment of coronaries with left heart cath and he agrees to proceed. Will arrange cath for 06/05/12 at 1pm in outpt cath lab. Pre-cath labs today. R/B reviewed.  

## 2012-06-05 NOTE — OR Nursing (Signed)
Dr McAlhany at bedside to discuss results and treatment plan with pt and family 

## 2012-06-05 NOTE — Interval H&P Note (Signed)
History and Physical Interval Note:  06/05/2012 12:46 PM  Thomas May  has presented today for cardiac cath with the diagnosis of chest apin  The various methods of treatment have been discussed with the patient and family. After consideration of risks, benefits and other options for treatment, the patient has consented to  Procedure(s) (LRB): JV LEFT HEART CATHETERIZATION WITH CORONARY ANGIOGRAM (N/A) as a surgical intervention .  The patient's history has been reviewed, patient examined, no change in status, stable for surgery.  I have reviewed the patient's chart and labs.  Questions were answered to the patient's satisfaction.     [name]

## 2012-06-06 ENCOUNTER — Telehealth: Payer: Self-pay | Admitting: Internal Medicine

## 2012-06-06 ENCOUNTER — Other Ambulatory Visit: Payer: Self-pay | Admitting: Internal Medicine

## 2012-06-06 ENCOUNTER — Other Ambulatory Visit: Payer: Self-pay | Admitting: *Deleted

## 2012-06-06 DIAGNOSIS — R918 Other nonspecific abnormal finding of lung field: Secondary | ICD-10-CM

## 2012-06-06 DIAGNOSIS — C3490 Malignant neoplasm of unspecified part of unspecified bronchus or lung: Secondary | ICD-10-CM

## 2012-06-06 NOTE — Telephone Encounter (Signed)
Discussed with daughter Thomas May - rec onc eval asap

## 2012-06-06 NOTE — Telephone Encounter (Signed)
S/W pt dtr Thomas May she states she will rtn call after speaking with brother Dannielle Huh about appt.

## 2012-06-06 NOTE — Telephone Encounter (Signed)
Spoke with Almira Coaster. She states that Dr. Rodena Medin has already made referral and there is nothing further needed. I do not know how to cancel our referral, so will forward to [facility] so that they are aware.

## 2012-06-06 NOTE — Telephone Encounter (Signed)
Fine with me - libby was already working on this referral for here though and will need to cancel it

## 2012-06-06 NOTE — Telephone Encounter (Signed)
Called daughter and advised Myriam Jacobson is working on the referral

## 2012-06-06 NOTE — Telephone Encounter (Signed)
Please advise if this is okay thanks! 

## 2012-06-06 NOTE — Telephone Encounter (Signed)
Pulmonary finally let Thomas May know that the lung has cancer.  He wants to refer him to Dr Jerolyn Center at [facility]  The family wants him to go to [facility].  They need a referral for [facility] and cant get pulmonary to call them back.   Could you put in the referral for them.

## 2012-06-06 NOTE — Telephone Encounter (Signed)
Done. Next available [facility] and need records sent.

## 2012-06-06 NOTE — Telephone Encounter (Signed)
Referral faxed to oncology pt will be called for an appt Thomas May

## 2012-06-07 ENCOUNTER — Telehealth: Payer: Self-pay | Admitting: Internal Medicine

## 2012-06-07 ENCOUNTER — Telehealth: Payer: Self-pay | Admitting: *Deleted

## 2012-06-07 ENCOUNTER — Other Ambulatory Visit: Payer: Self-pay | Admitting: Internal Medicine

## 2012-06-07 DIAGNOSIS — C3490 Malignant neoplasm of unspecified part of unspecified bronchus or lung: Secondary | ICD-10-CM

## 2012-06-07 NOTE — Telephone Encounter (Signed)
Spoke to tracy she needed path reports for the appt to see dr hodgin [facility]  Tobe Sos

## 2012-06-07 NOTE — Telephone Encounter (Signed)
Order placed

## 2012-06-07 NOTE — Telephone Encounter (Signed)
[facility] would prefer that the patient have a PET scan prior to seeing them next week.

## 2012-06-07 NOTE — Telephone Encounter (Signed)
Spoke with daughter regarding appt time and place 06/13/12 at 9:00.  She verbalized understanding

## 2012-06-07 NOTE — Telephone Encounter (Signed)
[facility] [place] is calling asking to speak to [facility] about patient.

## 2012-06-08 ENCOUNTER — Telehealth: Payer: Self-pay | Admitting: Internal Medicine

## 2012-06-08 ENCOUNTER — Ambulatory Visit: Payer: No Typology Code available for payment source | Admitting: Internal Medicine

## 2012-06-08 NOTE — Telephone Encounter (Signed)
Referring Dr. Sherene Sires Dx- Lung NP packet mailed out.

## 2012-06-13 ENCOUNTER — Other Ambulatory Visit (HOSPITAL_BASED_OUTPATIENT_CLINIC_OR_DEPARTMENT_OTHER): Payer: No Typology Code available for payment source | Admitting: Lab

## 2012-06-13 ENCOUNTER — Telehealth: Payer: Self-pay | Admitting: Internal Medicine

## 2012-06-13 ENCOUNTER — Ambulatory Visit: Payer: No Typology Code available for payment source

## 2012-06-13 ENCOUNTER — Encounter: Payer: Self-pay | Admitting: Internal Medicine

## 2012-06-13 ENCOUNTER — Ambulatory Visit (HOSPITAL_BASED_OUTPATIENT_CLINIC_OR_DEPARTMENT_OTHER): Payer: No Typology Code available for payment source | Admitting: Internal Medicine

## 2012-06-13 VITALS — BP 116/78 | HR 67 | Temp 98.6°F | Resp 20 | Ht 65.0 in | Wt 136.1 lb

## 2012-06-13 DIAGNOSIS — R222 Localized swelling, mass and lump, trunk: Secondary | ICD-10-CM

## 2012-06-13 DIAGNOSIS — C3430 Malignant neoplasm of lower lobe, unspecified bronchus or lung: Secondary | ICD-10-CM

## 2012-06-13 DIAGNOSIS — C3490 Malignant neoplasm of unspecified part of unspecified bronchus or lung: Secondary | ICD-10-CM

## 2012-06-13 DIAGNOSIS — R918 Other nonspecific abnormal finding of lung field: Secondary | ICD-10-CM

## 2012-06-13 LAB — COMPREHENSIVE METABOLIC PANEL (CC13)
ALT: 72 U/L — ABNORMAL HIGH (ref 0–55)
Alkaline Phosphatase: 93 U/L (ref 40–150)
CO2: 21 mEq/L — ABNORMAL LOW (ref 22–29)
Sodium: 137 mEq/L (ref 136–145)
Total Bilirubin: 0.6 mg/dL (ref 0.20–1.20)
Total Protein: 7.2 g/dL (ref 6.4–8.3)

## 2012-06-13 LAB — CBC WITH DIFFERENTIAL/PLATELET
BASO%: 0.6 % (ref 0.0–2.0)
LYMPH%: 24.9 % (ref 14.0–49.0)
MCHC: 33.8 g/dL (ref 32.0–36.0)
MONO#: 0.4 10*3/uL (ref 0.1–0.9)
MONO%: 4.4 % (ref 0.0–14.0)
Platelets: 238 10*3/uL (ref 140–400)
RBC: 5.21 10*6/uL (ref 4.20–5.82)
RDW: 13.3 % (ref 11.0–14.6)
WBC: 8.6 10*3/uL (ref 4.0–10.3)

## 2012-06-13 NOTE — Progress Notes (Signed)
[facility] [phone]   [phone]  CONSULT NOTE  REASON FOR CONSULTATION:  66 years old Asian male recently diagnosed with lung cancer.  HPI [facility] is a 66 y.o. male with past medical history significant for benign prostatic hypertrophy, gastric ulcer 15 years ago as well as long history of smoking but quit recently. The patient mentioned that 3 months ago he started complaining of left shoulder pain. He was over the counter medication with no improvement. He was also treated with acupuncture with no significant improvement.and on 05/16/2012 the patient had chest x-ray performed. It showed Numerous nodular and mass-like opacities in the lungs bilaterally. Differential considerations would include metastatic disease and atypical (including fungal, mycobacterial and other  organisms such as nocardia) infectious processes. This was followed by CT scan of the chest with contrast on 05/19/2012 and it showed numerous pulmonary nodules and masses scattered throughout the lungs bilaterally, the appearance is highly unusual.  Many of the nodules have central areas of cavitation, and several have air bronchograms. These features are suggestive of an  infectious process, with differential considerations including both fungal and atypical organisms (including nocardial and  mycobacterial organisms). Although metastatic disease remains a possibility, the strong upper lung predominance of these nodules  and their imaging appearance makes metastatic disease can less favored in the differential diagnosis. 2. However, the largest lesion in the right middle lobe, a 5.8 x 4.0 cm macrolobulated mass with spiculated margins, has a highly aggressive appearance. This could be part of the same (presumably infectious) process, or could represent a separate bronchogenic neoplasm.the patient was referred to Dr. Sherene Sires and on 05/31/2012 he underwent a video flexible fiberoptic  bronchoscopy with bronchoalveolar lavage and transbronchial wedge biopsy of the right middle lobe mass. The final pathology was positive for non-small cell carcinoma, favor adenocarcinoma. Sections show nests of cohesive epithelioid cells embedded within benign lungparenchyma. Immunohistochemical stains are performed. The cells are positive for cytokeratin AE1/AE3 and cytokeratin 7. Although TTF-1, cytokeratin 20 and CDX-2 are performed, due to the paucity of material, there is not tumor on deeper sections for evaluation of these stains. It is unclear if the carcinoma represents a primary or metastatic carcinoma based on the morphology and immunohistochemical staining pattern. The immunohistochemical staining pattern is not specific in the differential diagnosis is wide. Additional tissue is necessary for further characterization. Close clinical and radiologic correlation is strongly recommended The patient was referred to me today for further evaluation and recommendation regarding treatment of his condition. He mentions that the shoulder pain is much better after receiving steroid injection to that area. He denied having any significant complaints he specifically no chest pain, shortness breath, no cough or hemoptysis. He denied having any significant weight loss or night sweats. He denied having any visual changes or headache. His family history questionable for lung cancer. The patient is married and has 2 children. He is originally from [place]. He was accompanied by his wife Thomas May, his daughter Thomas May and his son Thomas May. The patient was also accompanied by a [place] interpreter. He works in [employer] with a lot of exposure to chemicals. He also has a history of smoking and half pack per day for around 40 years but quit 3 weeks ago, no history of alcohol or drug abuse.   @SFHPI @  Past Medical History  Diagnosis Date  . Tobacco use   . Lung mass   . BPH (benign prostatic hyperplasia)     Past  Surgical  History  Procedure Date  . Video bronchoscopy 05/31/2012    Procedure: VIDEO BRONCHOSCOPY WITH FLUORO;  Surgeon: Nyoka Cowden, MD;  Location: [facility];  Service: Endoscopy;  Laterality: Bilateral;    Family History  Problem Relation Age of Onset  . Ulcers Other   . Coronary artery disease Neg Hx     Social History History  Substance Use Topics  . Smoking status: Former Smoker -- 0.5 packs/day for 40 years    Types: Cigarettes    Quit date: 05/11/2012  . Smokeless tobacco: Never Used   Comment: Patient is using Nicotine patches.  . Alcohol Use: 1.5 oz/week    3 drink(s) per week     three times weekly    No Known Allergies  Current Outpatient Prescriptions  Medication Sig Dispense Refill  . Multiple Vitamin (MULTIVITAMIN) tablet Take 1 tablet by mouth daily.      . Naproxen Sodium (ALEVE PO) Take by mouth 2 (two) times daily.      . Saw Palmetto, Serenoa repens, (SAW PALMETTO PO) Take 1 capsule by mouth daily.      Marland Kitchen terazosin (HYTRIN) 1 MG capsule Take 1 capsule (1 mg total) by mouth at bedtime.  30 capsule  5    Review of Systems  A comprehensive review of systems was negative except for: Constitutional: positive for fatigue Musculoskeletal: positive for mild left shoulder pain  Physical Exam  ZOX:WRUEA, healthy, no distress, well nourished and well developed SKIN: skin color, texture, turgor are normal HEAD: Normocephalic, No masses, lesions, tenderness or abnormalities EYES: normal EARS: External ears normal OROPHARYNX:no exudate and no erythema  NECK: supple, no adenopathy LYMPH:  no palpable lymphadenopathy, no hepatosplenomegaly LUNGS: scattered rhonchi bilaterally HEART: regular rate & rhythm, no murmurs and no gallops ABDOMEN:abdomen soft, non-tender, normal bowel sounds and no masses or organomegaly BACK: Back symmetric, no curvature. EXTREMITIES:no joint deformities, effusion, or inflammation, no edema, no skin discoloration, no clubbing,  no cyanosis  NEURO: alert & oriented x 3 with fluent speech, no focal motor/sensory deficits, gait normal  PERFORMANCE STATUS: ECOG 1  LABORATORY DATA: Lab Results  Component Value Date   WBC 8.6 06/13/2012   HGB 15.7 06/13/2012   HCT 46.5 06/13/2012   MCV 89.2 06/13/2012   PLT 238 06/13/2012      Chemistry      Component Value Date/Time   NA 137 06/13/2012 0916   NA 141 06/02/2012 1611   K 3.9 06/13/2012 0916   K 3.9 06/02/2012 1611   CL 107 06/13/2012 0916   CL 108 06/02/2012 1611   CO2 21* 06/13/2012 0916   CO2 24 06/02/2012 1611   BUN 21.0 06/13/2012 0916   BUN 13 06/02/2012 1611   CREATININE 0.8 06/13/2012 0916   CREATININE 0.65 06/02/2012 1611      Component Value Date/Time   CALCIUM 9.5 06/13/2012 0916   CALCIUM 9.6 06/02/2012 1611   ALKPHOS 93 06/13/2012 0916   AST 38* 06/13/2012 0916   ALT 72* 06/13/2012 0916   BILITOT 0.60 06/13/2012 0916       RADIOGRAPHIC STUDIES: Dg Chest 2 View  05/16/2012  *RADIOLOGY REPORT*  Clinical Data: Tobacco use.  Shoulder pain.  CHEST - 2 VIEW  Comparison: No priors.  Findings: Two views of the chest demonstrate multiple bilateral nodular and masslike opacities highly concerning for metastatic disease.  However, these are predominantly in the mid and upper lungs which is unusual for metastatic disease (which should have a mid and lower lung  predominance).  No pleural effusions.  No evidence of pulmonary edema.  Heart size is normal.  There is right hilar and infrahilar fullness, but mediastinal contours are otherwise unremarkable.  IMPRESSION: 1.  Numerous nodular and mass-like opacities in the lungs bilaterally.  Differential considerations would include metastatic disease and atypical (including fungal, mycobacterial and other organisms such as nocardia) infectious processes.  Further evaluation with contrast enhanced CT of the thorax is highly recommended at this time.  These results were called by telephone on 05/16/2012 at 04:20 p.m. to Dr. Rodena Medin, who verbally  acknowledged these results.  Original Report Authenticated By: Florencia Reasons, M.D.   Ct Chest W Contrast  05/17/2012  *RADIOLOGY REPORT*  Clinical Data: Abnormal chest x-ray.  History of tobacco abuse.  CT CHEST WITH CONTRAST  Technique:  Multidetector CT imaging of the chest was performed following the standard protocol during bolus administration of intravenous contrast.  Contrast: 80mL OMNIPAQUE IOHEXOL 300 MG/ML  SOLN  Comparison: Chest x-ray 05/16/2012.  Findings:  Mediastinum: Heart size is borderline enlarged. There is no significant pericardial fluid, thickening or pericardial calcification. There is atherosclerosis of the thoracic aorta, the great vessels of the mediastinum and the coronary arteries, including calcified atherosclerotic plaque in the left main, left anterior descending, left circumflex and right coronary arteries. Right hilar adenopathy measuring 1.6 x 2.2 cm.  Multiple other borderline enlarged mediastinal lymph nodes are noted.  Prominent but not large left hilar lymph nodes are noted.  Esophagus is unremarkable in appearance.  Lungs/Pleura: There are numerous bilateral pulmonary nodules and masses.  The largest single lesion is in the medial segment of the right middle lobe measuring 5.8 x 4.0 cm.  Several of these lesions have some internal areas of cavitation (example in the posterolateral aspect of the right upper lobe on image 16 of series 3), while other lesions appear to have internal air bronchograms (examples include the right upper lobe on image 17 of series 3 and left upper lobe on image 16 of series 3).  Additionally, there is a definite upper lung predominance of these pulmonary nodules and masses, with some relative sparing of the lung bases; findings that are not typical for metastatic disease which is typically basilar predominant.  Extensive peribronchovascular airspace consolidation is also noted in the right middle lobe.  No pleural effusions.  Upper Abdomen: Well  defined 11 mm low attenuation lesion in segment 8 of the liver is incompletely characterized.  Otherwise, unremarkable.  Musculoskeletal: There are no aggressive appearing lytic or blastic lesions noted in the visualized portions of the skeleton.  Old healed fracture of the posterolateral aspect of the right fifth rib is incidentally noted.  IMPRESSION: 1.  While there are numerous pulmonary nodules and masses scattered throughout the lungs bilaterally, the appearance is highly unusual. Many of the nodules have central areas of cavitation, and several have air bronchograms.  These features are suggestive of an infectious process, with differential considerations including both fungal and atypical organisms (including nocardial and mycobacterial organisms).  Although metastatic disease remains a possibility, the strong upper lung predominance of these nodules and their imaging appearance makes metastatic disease can less favored in the differential diagnosis. 2.  However, the largest lesion in the right middle lobe, a 5.8 x 4.0 cm macrolobulated mass with spiculated margins, has a highly aggressive appearance.  This could be part of the same (presumably infectious) process, or could represent a separate bronchogenic neoplasm.  Clinical correlation is recommended, with consideration for further evaluation  with bronchoscopy to attempt to identify causative organisms and/or attempt a biopsy of the right middle lobe mass lesion.  3. Atherosclerosis, including left main and three-vessel coronary artery disease. Assessment for potential risk factor modification, dietary therapy or pharmacologic therapy may be warranted, if clinically indicated. 4.  11 mm low attenuation lesion in segment 8 of the liver is not definitively characterized on this examination, but is favored to represent a small cyst.  These results were called by telephone on 05/17/2012 at 04:10 p.m. to PA Daryel Gerald (for Dr. Rodena Medin), who verbally  acknowledged these results.  Original Report Authenticated By: Florencia Reasons, M.D.   Dg Chest Port 1 View  05/31/2012  *RADIOLOGY REPORT*  Clinical Data: Right middle lobe lung biopsy.  Scattered pulmonary nodules.  PORTABLE CHEST - 1 VIEW  Comparison: 05/17/2012  Findings: Scattered pulmonary nodules are again identified along with a right middle lobe mass.  No pneumothorax observed.  Heart size is within normal limits. Mild interstitial accentuation noted.  IMPRESSION:  1.  No post biopsy pneumothorax observed. 2.  Mild interstitial accentuation, query mild fluid overload or low-level interstitial edema. 3.  Scattered pulmonary nodules; right middle lobe mass.   Original Report Authenticated By: Dellia Cloud, M.D.    Dg Shoulder Left  05/16/2012  *RADIOLOGY REPORT*  Clinical Data: Left-sided shoulder pain with abduction.  LEFT SHOULDER - 2+ VIEW  Comparison: No priors.  Findings: Three views of the left shoulder demonstrate no acute fracture, subluxation or dislocation.  There are some subtle potential lucencies in the scapula near the neck and glenoid, however, this is uncertain.  Visualized portions of the thorax are remarkable for multifocal nodular opacities (see dictation for chest x-ray 05/16/2012).  IMPRESSION: 1.  No acute radiographic abnormality of the left shoulder. 2.  Potential subtle permeative pattern in the left scapula.  This is not definitive, however, and correlation with forthcoming CT of the thorax is recommended to exclude an aggressive bony lesion in this region.  Original Report Authenticated By: Florencia Reasons, M.D.   Dg C-arm Bronchoscopy  05/31/2012  CLINICAL DATA: bronchoscopy   C-ARM BRONCHOSCOPY  Fluoroscopy was utilized by the requesting physician.  No radiographic  interpretation.      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  PLAN: I have a  lengthy discussion with the patient and his family today about his disease stage, prognosis and treatment options.the patient is of a [place] descent and probably has a higher incidence for EGFR mutation or ALK gene translocation. #1 I will contact the pathology Department to see if the patient has enough tissue to be sent for genetic mutation studies. If the patient has no enough tissue I may consider rebiopsy of one of the lung lesion. #2 I will order a PET scan as well as MRI of the brain to complete the staging workup. #3 if the patient has evidence for EGFR mutation or ALK gene translocation, he would be treated with oral target therapy like Tarceva or Xalkori. #4 if the patient has no evidence for mutation, I would consider him for systemic chemotherapy with carboplatin and Alimta versus involvement in a clinical trial with the ECoG protocol 5508.  #5 I would see the patient back for followup visit in 2 weeks for evaluation and discussion of his treatment options based on the genetic study as well as the imaging results. I gave the patient and  his family the time to ask questions and answered them completely to their satisfaction. The patient was advised to call me immediately if he has any concerning symptoms in the interval.  All questions were answered. The patient knows to call the clinic with any problems, questions or concerns. We can certainly see the patient much sooner if necessary.  Thank you so much for allowing me to participate in the care of Chelsea Cupit. I will continue to follow up the patient with you and assist in his care.  I spent 30 minutes counseling the patient face to face. The total time spent in the appointment was 60 minutes.  Yanna Leaks K. 06/13/2012, 11:17 AM

## 2012-06-13 NOTE — Progress Notes (Signed)
Checked in new pt.  Had no financial concerns at this time. °

## 2012-06-13 NOTE — Patient Instructions (Signed)
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 We'll send tissue block to be tested for EGFR mutation and the ALK gene translocation. Followup in 2 weeks

## 2012-06-13 NOTE — Telephone Encounter (Signed)
gve the pt's son the sept 2013 appt calendar along with the scan appts with instructions. S/w luwanna from care management and she is aware of the interpreter request.

## 2012-06-13 NOTE — Progress Notes (Signed)
Distress assessment completed.  Pt scored 7/10.  Called Lauren, Child psychotherapist to inform her.  SLJ

## 2012-06-15 ENCOUNTER — Ambulatory Visit: Payer: No Typology Code available for payment source | Admitting: Internal Medicine

## 2012-06-16 ENCOUNTER — Telehealth: Payer: Self-pay | Admitting: *Deleted

## 2012-06-16 NOTE — Telephone Encounter (Signed)
Received fax from [facility] that the molecular profiling that Dr Donnald Garre ordered on a specimen has been forwarded to another institution for analysis and this could cause a delay in resulting.  Fax given to Dr Donnald Garre to review.  SLJ

## 2012-06-21 ENCOUNTER — Encounter (HOSPITAL_COMMUNITY): Payer: No Typology Code available for payment source

## 2012-06-21 ENCOUNTER — Inpatient Hospital Stay (HOSPITAL_COMMUNITY): Admission: RE | Admit: 2012-06-21 | Payer: No Typology Code available for payment source | Source: Ambulatory Visit

## 2012-06-21 ENCOUNTER — Telehealth: Payer: Self-pay | Admitting: Medical Oncology

## 2012-06-21 NOTE — Telephone Encounter (Signed)
Left message to call re PET scan and f/u. Traci called back and said pt has PET/MRI scheduled at [facility] on Monday and will get treatment on Tuesday. She also said the MD will re-biopsy pt because there was not enough tissue for molecualr studies.for [facility]. Dr Arbutus Ped notified. Brett Canales in radiology notified.

## 2012-06-22 ENCOUNTER — Telehealth: Payer: Self-pay | Admitting: Medical Oncology

## 2012-06-22 NOTE — Telephone Encounter (Signed)
Confirmed to cancel test as pt going to [facility] for further evaluation and treatment

## 2012-06-23 ENCOUNTER — Ambulatory Visit: Payer: No Typology Code available for payment source | Admitting: Internal Medicine

## 2012-06-26 ENCOUNTER — Telehealth: Payer: Self-pay | Admitting: Medical Oncology

## 2012-06-26 ENCOUNTER — Other Ambulatory Visit: Payer: Self-pay | Admitting: Medical Oncology

## 2012-06-26 ENCOUNTER — Telehealth: Payer: Self-pay | Admitting: Internal Medicine

## 2012-06-26 LAB — FUNGUS CULTURE W SMEAR: Fungal Smear: NONE SEEN

## 2012-06-26 NOTE — Telephone Encounter (Signed)
pt being treated at [facility]    aom

## 2012-06-26 NOTE — Telephone Encounter (Signed)
Per last week phone call to  Traci at [facility] -pt is getting treatment at [facility]. I left message on [facility] voice mail that we cancelled pt appt tomorrow with Dr Donnald Garre because we heard he was getting treated at [facility]. I asked her to call back and confirm this information.

## 2012-06-27 ENCOUNTER — Ambulatory Visit: Payer: No Typology Code available for payment source | Admitting: Internal Medicine

## 2012-06-27 ENCOUNTER — Other Ambulatory Visit: Payer: No Typology Code available for payment source | Admitting: Lab

## 2012-07-05 ENCOUNTER — Ambulatory Visit: Payer: No Typology Code available for payment source | Admitting: Cardiovascular Disease

## 2012-07-10 ENCOUNTER — Encounter: Payer: Self-pay | Admitting: Internal Medicine

## 2012-07-10 ENCOUNTER — Ambulatory Visit (INDEPENDENT_AMBULATORY_CARE_PROVIDER_SITE_OTHER): Payer: No Typology Code available for payment source | Admitting: Internal Medicine

## 2012-07-10 VITALS — BP 104/76 | HR 73 | Temp 98.6°F | Resp 14 | Wt 132.0 lb

## 2012-07-10 DIAGNOSIS — I2510 Atherosclerotic heart disease of native coronary artery without angina pectoris: Secondary | ICD-10-CM

## 2012-07-10 DIAGNOSIS — IMO0001 Reserved for inherently not codable concepts without codable children: Secondary | ICD-10-CM

## 2012-07-10 DIAGNOSIS — C801 Malignant (primary) neoplasm, unspecified: Secondary | ICD-10-CM

## 2012-07-10 DIAGNOSIS — C3490 Malignant neoplasm of unspecified part of unspecified bronchus or lung: Secondary | ICD-10-CM

## 2012-07-10 DIAGNOSIS — K5900 Constipation, unspecified: Secondary | ICD-10-CM | POA: Insufficient documentation

## 2012-07-10 NOTE — Progress Notes (Signed)
  Subjective:    Patient ID: [facility], male    DOB: 04-25-1946, 66 y.o.   MRN: [id number]  HPI Pt presents to clinic for followup of multiple medical problems. Since last visit diagnosed with metastatic lung cancer now undergoing chemotherapy status post initial round. Tolerating well. 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 Patient states takes it at night in order to sleep. Has had recent constipation described as small hard stools. Has mild complaints of distention but no nausea or vomiting. Has attempted no medication for this. Total time of visit approximately 28 minutes of which greater than 50% of the time was spent in counseling.  Past Medical History  Diagnosis Date  . Tobacco use   . Lung mass   . BPH (benign prostatic hyperplasia)    Past Surgical History  Procedure Date  . Video bronchoscopy 05/31/2012    Procedure: VIDEO BRONCHOSCOPY WITH FLUORO;  Surgeon: Nyoka Cowden, MD;  Location: [facility];  Service: Endoscopy;  Laterality: Bilateral;    reports that he quit smoking about 1 months ago. His smoking use included Cigarettes. He has a 20 pack-year smoking history. He has never used smokeless tobacco. He reports that he drinks about 1.5 ounces of alcohol per week. He reports that he does not use illicit drugs. family history includes Ulcers in his other.  There is no history of Coronary artery disease. No Known Allergies    Review of Systems see hpi     Objective:   Physical Exam  Nursing note and vitals reviewed. Constitutional: He appears well-developed and well-nourished. No distress.  Neurological: He is alert.  Skin: He is not diaphoretic.  Psychiatric: He has a normal mood and affect.          Assessment & Plan:

## 2012-07-10 NOTE — Assessment & Plan Note (Signed)
Stable. Followed by [facility] for chemotherapy currently. They are prescribing Vicodin for metastatic bone pain

## 2012-07-10 NOTE — Assessment & Plan Note (Signed)
Likely related to narcotic use. Recommend stool softener daily. MiraLax when necessary

## 2012-07-13 LAB — AFB CULTURE WITH SMEAR (NOT AT ARMC): Special Requests: NORMAL

## 2012-08-08 ENCOUNTER — Telehealth: Payer: Self-pay | Admitting: Internal Medicine

## 2012-08-08 ENCOUNTER — Other Ambulatory Visit: Payer: Self-pay | Admitting: Internal Medicine

## 2012-08-08 NOTE — Telephone Encounter (Signed)
Megace 800mg  prescribed and pended. Don't know which pharmacy

## 2012-08-08 NOTE — Telephone Encounter (Signed)
Patient is having problems eatting due to loss of appetite.  Someone at his church suggested megestrol acetate.  This person had taken it while on chemo and it helped.  Daughter wondered if you would send in an rx for this med

## 2012-08-09 MED ORDER — MEGESTROL ACETATE 40 MG/ML PO SUSP: 800.0000 mg | Freq: Every day | ORAL | Status: DC

## 2012-08-09 NOTE — Telephone Encounter (Signed)
LMOM with contact name & number that Rx requested had been sent to [facility] pharmacy per [escript request]/SLS

## 2012-11-21 ENCOUNTER — Telehealth: Payer: Self-pay | Admitting: Medical Oncology

## 2012-11-21 DIAGNOSIS — IMO0001 Reserved for inherently not codable concepts without codable children: Secondary | ICD-10-CM

## 2012-11-21 NOTE — Telephone Encounter (Signed)
Requests f/u with Dr Berna Bue after  March 1. ONC tx request sent

## 2012-11-22 ENCOUNTER — Telehealth: Payer: Self-pay | Admitting: Internal Medicine

## 2012-11-30 ENCOUNTER — Telehealth: Payer: Self-pay | Admitting: Internal Medicine

## 2012-11-30 NOTE — Telephone Encounter (Signed)
Refill- terazosin 1mg  capsule. Take one capsule by mouth at bedtime. Qty 30 last fill 1.17.14

## 2012-12-01 MED ORDER — TERAZOSIN HCL 1 MG PO CAPS: 1.0000 mg | ORAL_CAPSULE | Freq: Every day | ORAL | Status: DC

## 2012-12-01 NOTE — Telephone Encounter (Signed)
Refill sent.

## 2012-12-08 ENCOUNTER — Other Ambulatory Visit: Payer: Self-pay | Admitting: *Deleted

## 2012-12-11 ENCOUNTER — Encounter: Payer: Self-pay | Admitting: Internal Medicine

## 2012-12-11 ENCOUNTER — Telehealth: Payer: Self-pay | Admitting: Internal Medicine

## 2012-12-11 ENCOUNTER — Telehealth: Payer: Self-pay | Admitting: *Deleted

## 2012-12-11 ENCOUNTER — Other Ambulatory Visit (HOSPITAL_BASED_OUTPATIENT_CLINIC_OR_DEPARTMENT_OTHER): Payer: BC Managed Care – PPO

## 2012-12-11 ENCOUNTER — Telehealth: Payer: Self-pay | Admitting: Hematology & Oncology

## 2012-12-11 ENCOUNTER — Ambulatory Visit (HOSPITAL_BASED_OUTPATIENT_CLINIC_OR_DEPARTMENT_OTHER): Payer: BC Managed Care – PPO | Admitting: Internal Medicine

## 2012-12-11 DIAGNOSIS — IMO0001 Reserved for inherently not codable concepts without codable children: Secondary | ICD-10-CM

## 2012-12-11 DIAGNOSIS — C3490 Malignant neoplasm of unspecified part of unspecified bronchus or lung: Secondary | ICD-10-CM

## 2012-12-11 DIAGNOSIS — C801 Malignant (primary) neoplasm, unspecified: Secondary | ICD-10-CM

## 2012-12-11 DIAGNOSIS — C3430 Malignant neoplasm of lower lobe, unspecified bronchus or lung: Secondary | ICD-10-CM

## 2012-12-11 LAB — COMPREHENSIVE METABOLIC PANEL (CC13)
ALT: 51 U/L (ref 0–55)
Alkaline Phosphatase: 75 U/L (ref 40–150)
Glucose: 97 mg/dl (ref 70–99)
Sodium: 141 mEq/L (ref 136–145)
Total Bilirubin: 0.59 mg/dL (ref 0.20–1.20)
Total Protein: 8.1 g/dL (ref 6.4–8.3)

## 2012-12-11 LAB — CBC WITH DIFFERENTIAL/PLATELET
BASO%: 1.1 % (ref 0.0–2.0)
LYMPH%: 30.3 % (ref 14.0–49.0)
MCH: 34.2 pg — ABNORMAL HIGH (ref 27.2–33.4)
MCHC: 34.9 g/dL (ref 32.0–36.0)
MCV: 97.8 fL (ref 79.3–98.0)
MONO%: 10 % (ref 0.0–14.0)
Platelets: 207 10*3/uL (ref 140–400)
RBC: 4.34 10*6/uL (ref 4.20–5.82)

## 2012-12-11 MED ORDER — DEXAMETHASONE 4 MG PO TABS: ORAL_TABLET | ORAL | Status: DC

## 2012-12-11 MED ORDER — FOLIC ACID 1 MG PO TABS: 1.0000 mg | ORAL_TABLET | Freq: Every day | ORAL | Status: DC

## 2012-12-11 NOTE — Patient Instructions (Signed)
We discussed her treatment options in details. I would order restaging scan of the chest, abdomen and pelvis before starting the next cycle of her chemotherapy. Followup in 1 week.

## 2012-12-11 NOTE — Progress Notes (Signed)
[facility] [phone]   Fax:(336) [phone]  OFFICE PROGRESS NOTE  [name], MD [address] [place] [address]  DIAGNOSIS: Metastatic non-small cell lung cancer favoring adenocarcinoma with negative EGFR mutation, negative ALK gene translocation and negative ROS1 diagnosed in August of 2013  PRIOR THERAPY: Status post 4 cycles of systemic chemotherapy with carboplatin and Alimta with partial response at [facility]. Last dose was given in November of 2013.  CURRENT THERAPY: Maintenance chemotherapy with Alimta 500 mg/M2 started on 09/26/2012, status post 3 cycles.  INTERVAL HISTORY: Thomas May 67 y.o. male returns to the clinic today for followup visit accompanied his wife, daughter and his interpreter. The patient is feeling fine today with no specific complaints. He was seen at the [facility] in September of 2013. I recommended for the patient molecular studies at that time and consideration of chemotherapy if he has negative mutation. He was seen at [facility] and was given the same options. He received first line chemotherapy with carboplatin and Alimta as well as 3 cycles of maintenance chemotherapy with Alimta at [facility]. The patient decided to come back to [facility] and continue his treatment locally. He is here today for evaluation and recommendation regarding his condition. He is feeling fine with no specific complaints except for occasional back pain. He tolerated the previous chemotherapy fairly well except for nausea for 5 days after his chemotherapy. He has Zofran at home but does not use it as needed. He did not have any imaging studies after the 3 cycles of maintenance chemotherapy. He denied having any significant chest pain, shortness breath, cough or hemoptysis. He denied having any fever or chills.  MEDICAL HISTORY: Past Medical History  Diagnosis Date  .  Tobacco use   . Lung mass   . BPH (benign prostatic hyperplasia)     ALLERGIES:  has No Known Allergies.  MEDICATIONS:  Current Outpatient Prescriptions  Medication Sig Dispense Refill  . docusate sodium (COLACE) 100 MG capsule Take 100 mg by mouth 2 (two) times daily.      . folic acid (FOLVITE) 1 MG tablet Take 1 mg by mouth daily.      . megestrol (MEGACE ORAL) 40 MG/ML suspension Take 20 mLs (800 mg total) by mouth daily.  480 mL  0  . Multiple Vitamin (MULTIVITAMIN) tablet Take 1 tablet by mouth daily.      . Naproxen Sodium (ALEVE PO) Take by mouth 2 (two) times daily.      . prochlorperazine (COMPAZINE) 10 MG tablet Take 5 mg by mouth every 6 (six) hours as needed. Take 0.5 tablets (5 mg total) by mouth every 6 (six) hours as needed for Nausea.      . Saw Palmetto, Serenoa repens, (SAW PALMETTO PO) Take 1 capsule by mouth daily.      Marland Kitchen terazosin (HYTRIN) 1 MG capsule Take 1 capsule (1 mg total) by mouth at bedtime.  30 capsule  3   No current facility-administered medications for this visit.    SURGICAL HISTORY:  Past Surgical History  Procedure Laterality Date  . Video bronchoscopy  05/31/2012    Procedure: VIDEO BRONCHOSCOPY WITH FLUORO;  Surgeon: Nyoka Cowden, MD;  Location: [facility];  Service: Endoscopy;  Laterality: Bilateral;    REVIEW OF SYSTEMS:  A comprehensive review of systems was negative except for: Constitutional: positive for fatigue Musculoskeletal: positive for back pain  PHYSICAL EXAMINATION: General appearance: alert, cooperative, fatigued and no distress Head: Normocephalic, without obvious abnormality, atraumatic Neck: no adenopathy Lymph nodes: Cervical, supraclavicular, and axillary nodes normal. Resp: clear to auscultation bilaterally Cardio: regular rate and rhythm, S1, S2 normal, no murmur, click, rub or gallop GI: soft, non-tender; bowel sounds normal; no masses,  no organomegaly Extremities: extremities normal, atraumatic, no cyanosis or  edema Neurologic: Alert and oriented X 3, normal strength and tone. Normal symmetric reflexes. Normal coordination and gait  ECOG PERFORMANCE STATUS: 1 - Symptomatic but completely ambulatory  Blood pressure 147/94, pulse 72, temperature 98.5 F (36.9 C), temperature source Oral, resp. rate 20, height 5\' 5"  (1.651 m), weight 147 lb 8 oz (66.906 kg).  LABORATORY DATA: Lab Results  Component Value Date   WBC 6.9 12/11/2012   HGB 14.8 12/11/2012   HCT 42.5 12/11/2012   MCV 97.8 12/11/2012   PLT 207 12/11/2012      Chemistry      Component Value Date/Time   NA 137 06/13/2012 0916   NA 141 06/02/2012 1611   K 3.9 06/13/2012 0916   K 3.9 06/02/2012 1611   CL 107 06/13/2012 0916   CL 108 06/02/2012 1611   CO2 21* 06/13/2012 0916   CO2 24 06/02/2012 1611   BUN 21.0 06/13/2012 0916   BUN 13 06/02/2012 1611   CREATININE 0.8 06/13/2012 0916   CREATININE 0.65 06/02/2012 1611      Component Value Date/Time   CALCIUM 9.5 06/13/2012 0916   CALCIUM 9.6 06/02/2012 1611   ALKPHOS 93 06/13/2012 0916   AST 38* 06/13/2012 0916   ALT 72* 06/13/2012 0916   BILITOT 0.60 06/13/2012 0916       RADIOGRAPHIC STUDIES: No results found.  ASSESSMENT: This is a very pleasant 67 years old Asian male with metastatic non-small cell lung cancer, adenocarcinoma status post 4 cycles of induction chemotherapy with carboplatin and Alimta followed by 3 cycles of maintenance chemotherapy with single agent Alimta. The patient is doing fine except for mild fatigue and low back pain.  PLAN: I have a lengthy discussion with the patient and his family today about his condition. I recommended for him to have repeat CT scan of the chest, abdomen and pelvis for restaging of his disease. I advised the patient and his daughter to get a copy of the CDs of his previous scan from [facility] for comparison. I will arrange for the patient to resume his maintenance chemotherapy with single agent Alimta 500 mg/M2 every 3 weeks of starting from next  week and he has no evidence for disease progression. I will call his pharmacy with Decadron 4 mg by mouth twice a day the day before, day of and day after the chemotherapy. He was also advised to take Zofran as needed for nausea. He would be given refill folic acid 1 mg by mouth daily. The patient would come back for followup visit in one week for evaluation and discussion of his scan results before starting the next cycle of his chemotherapy. He was advised to call immediately if he has any concerning symptoms in the interval.  All questions were answered. The patient knows to call the clinic with any problems, questions or concerns. We can certainly see the patient much sooner if necessary.  I spent 20 minutes counseling the patient face to face. The total time spent in the appointment was 30 minutes.

## 2012-12-11 NOTE — Telephone Encounter (Signed)
Sheneka called from [facility] scheduling to sch apts for patient to come out here due to being closer.  Sheneka sch apts for 3/18; 3/25; 4/18; and 4/15.  Patient is aware of apt through [facility]

## 2012-12-11 NOTE — Telephone Encounter (Signed)
gv pt appt shcedulel to march...emialed michelle to add tx...pt aware.Marland KitchenMarland Kitchenpt wants all lab o9nly appt @ [place] location

## 2012-12-11 NOTE — Telephone Encounter (Signed)
Per staff message and POF I have scheduled appts.  JMW  

## 2012-12-15 ENCOUNTER — Ambulatory Visit (HOSPITAL_COMMUNITY): Payer: Self-pay

## 2012-12-18 ENCOUNTER — Ambulatory Visit (HOSPITAL_COMMUNITY)
Admission: RE | Admit: 2012-12-18 | Discharge: 2012-12-18 | Disposition: A | Discharge status: Home / Self Care | Payer: BC Managed Care – PPO | Source: Ambulatory Visit | Attending: Internal Medicine | Admitting: Internal Medicine

## 2012-12-18 DIAGNOSIS — N210 Calculus in bladder: Secondary | ICD-10-CM | POA: Insufficient documentation

## 2012-12-18 DIAGNOSIS — C3490 Malignant neoplasm of unspecified part of unspecified bronchus or lung: Secondary | ICD-10-CM | POA: Insufficient documentation

## 2012-12-18 DIAGNOSIS — K7689 Other specified diseases of liver: Secondary | ICD-10-CM | POA: Insufficient documentation

## 2012-12-18 DIAGNOSIS — Z79899 Other long term (current) drug therapy: Secondary | ICD-10-CM | POA: Insufficient documentation

## 2012-12-18 DIAGNOSIS — R918 Other nonspecific abnormal finding of lung field: Secondary | ICD-10-CM | POA: Insufficient documentation

## 2012-12-18 DIAGNOSIS — R599 Enlarged lymph nodes, unspecified: Secondary | ICD-10-CM | POA: Insufficient documentation

## 2012-12-18 DIAGNOSIS — N323 Diverticulum of bladder: Secondary | ICD-10-CM | POA: Insufficient documentation

## 2012-12-18 DIAGNOSIS — N402 Nodular prostate without lower urinary tract symptoms: Secondary | ICD-10-CM | POA: Insufficient documentation

## 2012-12-18 MED ORDER — IOHEXOL 300 MG/ML  SOLN
100.0000 mL | Freq: Once | INTRAMUSCULAR | Status: AC | PRN
  Administered 2012-12-18: 100 mL via INTRAVENOUS

## 2012-12-19 ENCOUNTER — Telehealth: Payer: Self-pay | Admitting: Internal Medicine

## 2012-12-19 ENCOUNTER — Encounter: Payer: Self-pay | Admitting: Internal Medicine

## 2012-12-19 ENCOUNTER — Ambulatory Visit (HOSPITAL_BASED_OUTPATIENT_CLINIC_OR_DEPARTMENT_OTHER): Payer: BC Managed Care – PPO | Admitting: Internal Medicine

## 2012-12-19 ENCOUNTER — Ambulatory Visit (HOSPITAL_BASED_OUTPATIENT_CLINIC_OR_DEPARTMENT_OTHER): Payer: BC Managed Care – PPO

## 2012-12-19 ENCOUNTER — Other Ambulatory Visit: Payer: BC Managed Care – PPO

## 2012-12-19 DIAGNOSIS — C3430 Malignant neoplasm of lower lobe, unspecified bronchus or lung: Secondary | ICD-10-CM

## 2012-12-19 DIAGNOSIS — Z5111 Encounter for antineoplastic chemotherapy: Secondary | ICD-10-CM

## 2012-12-19 LAB — COMPREHENSIVE METABOLIC PANEL (CC13)
ALT: 41 U/L (ref 0–55)
BUN: 18.1 mg/dL (ref 7.0–26.0)
CO2: 23 mEq/L (ref 22–29)
Creatinine: 1 mg/dL (ref 0.7–1.3)
Glucose: 139 mg/dl — ABNORMAL HIGH (ref 70–99)
Total Bilirubin: 0.78 mg/dL (ref 0.20–1.20)

## 2012-12-19 LAB — CBC WITH DIFFERENTIAL/PLATELET
Eosinophils Absolute: 0 10*3/uL (ref 0.0–0.5)
LYMPH%: 9 % — ABNORMAL LOW (ref 14.0–49.0)
MCV: 93.1 fL (ref 79.3–98.0)
MONO%: 4.6 % (ref 0.0–14.0)
NEUT#: 7.2 10*3/uL — ABNORMAL HIGH (ref 1.5–6.5)
NEUT%: 86.4 % — ABNORMAL HIGH (ref 39.0–75.0)
Platelets: 191 10*3/uL (ref 140–400)
RBC: 4.21 10*6/uL (ref 4.20–5.82)
nRBC: 0 % (ref 0–0)

## 2012-12-19 MED ORDER — SODIUM CHLORIDE 0.9 % IV SOLN
Freq: Once | INTRAVENOUS | Status: AC
  Administered 2012-12-19: 13:00:00 via INTRAVENOUS

## 2012-12-19 MED ORDER — ONDANSETRON 8 MG/50ML IVPB (CHCC)
8.0000 mg | Freq: Once | INTRAVENOUS | Status: AC
  Administered 2012-12-19: 8 mg via INTRAVENOUS

## 2012-12-19 MED ORDER — DEXAMETHASONE SODIUM PHOSPHATE 10 MG/ML IJ SOLN
10.0000 mg | Freq: Once | INTRAMUSCULAR | Status: AC
  Administered 2012-12-19: 10 mg via INTRAVENOUS

## 2012-12-19 MED ORDER — PEMETREXED DISODIUM CHEMO INJECTION 500 MG
500.0000 mg/m2 | Freq: Once | INTRAVENOUS | Status: AC
  Administered 2012-12-19: 875 mg via INTRAVENOUS
  Filled 2012-12-19: qty 35

## 2012-12-19 NOTE — Patient Instructions (Signed)
Your scan showed no evidence for disease progression. We'll resume maintenance chemotherapy with single agent Alimta. Followup visit in 3 weeks with the next cycle of chemotherapy.

## 2012-12-19 NOTE — Patient Instructions (Addendum)
[facility] Discharge Instructions for Patients Receiving Chemotherapy  Today you received the following chemotherapy agents: Alimta.  To help prevent nausea and vomiting after your treatment, we encourage you to take your nausea medication.   If you develop nausea and vomiting that is not controlled by your nausea medication, call the clinic.    BELOW ARE SYMPTOMS THAT SHOULD BE REPORTED IMMEDIATELY:  *FEVER GREATER THAN 100.5 F  *CHILLS WITH OR WITHOUT FEVER  NAUSEA AND VOMITING THAT IS NOT CONTROLLED WITH YOUR NAUSEA MEDICATION  *UNUSUAL SHORTNESS OF BREATH  *UNUSUAL BRUISING OR BLEEDING  TENDERNESS IN MOUTH AND THROAT WITH OR WITHOUT PRESENCE OF ULCERS  *URINARY PROBLEMS  *BOWEL PROBLEMS  UNUSUAL RASH Items with * indicate a potential emergency and should be followed up as soon as possible.  One of the nurses will contact you 24 hours after your treatment. Please let the nurse know about any problems that you may have experienced. Feel free to call the clinic you have any questions or concerns. The clinic phone number is [phone].

## 2012-12-19 NOTE — Progress Notes (Signed)
[facility] [phone]   Fax:(336) [phone]  OFFICE PROGRESS NOTE  [name], MD [address] [place] [address]  DIAGNOSIS: Metastatic non-small cell lung cancer favoring adenocarcinoma with negative EGFR mutation, negative ALK gene translocation and negative ROS1 diagnosed in August of 2013   PRIOR THERAPY: Status post 4 cycles of systemic chemotherapy with carboplatin and Alimta with partial response at [facility]. Last dose was given in November of 2013.   CURRENT THERAPY: Maintenance chemotherapy with Alimta 500 mg/M2 started on 09/26/2012, status post 3 cycles.   INTERVAL HISTORY: Thomas May 67 y.o. male returns to the clinic today for followup visit accompanied his wife and daughter. The patient is feeling fine today with no specific complaints. He had restaging scan of the chest, abdomen and pelvis performed recently and he is here for evaluation and discussion of his scan results and recommendation regarding treatment of his condition. He denied having any significant chest pain, shortness of breath, cough or hemoptysis. He denied having any weight loss or night sweats.    MEDICAL HISTORY: Past Medical History  Diagnosis Date  . Tobacco use   . Lung mass   . BPH (benign prostatic hyperplasia)     ALLERGIES:  has No Known Allergies.  MEDICATIONS:  Current Outpatient Prescriptions  Medication Sig Dispense Refill  . dexamethasone (DECADRON) 4 MG tablet 4 mg by mouth twice a day day before, day of and day after the chemotherapy every 3 weeks  40 tablet  0  . docusate sodium (COLACE) 100 MG capsule Take 100 mg by mouth 2 (two) times daily.      . folic acid (FOLVITE) 1 MG tablet Take 1 tablet (1 mg total) by mouth daily.  30 tablet  2  . megestrol (MEGACE ORAL) 40 MG/ML suspension Take 20 mLs (800 mg total) by mouth daily.  480 mL  0  . Multiple Vitamin (MULTIVITAMIN) tablet Take 1 tablet by mouth daily.       . Naproxen Sodium (ALEVE PO) Take by mouth 2 (two) times daily.      . prochlorperazine (COMPAZINE) 10 MG tablet Take 5 mg by mouth every 6 (six) hours as needed. Take 0.5 tablets (5 mg total) by mouth every 6 (six) hours as needed for Nausea.      . Saw Palmetto, Serenoa repens, (SAW PALMETTO PO) Take 1 capsule by mouth daily.      Marland Kitchen terazosin (HYTRIN) 1 MG capsule Take 1 capsule (1 mg total) by mouth at bedtime.  30 capsule  3   No current facility-administered medications for this visit.    SURGICAL HISTORY:  Past Surgical History  Procedure Laterality Date  . Video bronchoscopy  05/31/2012    Procedure: VIDEO BRONCHOSCOPY WITH FLUORO;  Surgeon: Nyoka Cowden, MD;  Location: [facility];  Service: Endoscopy;  Laterality: Bilateral;    REVIEW OF SYSTEMS:  A comprehensive review of systems was negative.   PHYSICAL EXAMINATION: General appearance: alert, cooperative and no distress Head: Normocephalic, without obvious abnormality, atraumatic Neck: no adenopathy Lymph nodes: Cervical, supraclavicular, and axillary nodes normal. Resp: clear to auscultation bilaterally Cardio: regular rate and rhythm, S1, S2 normal, no murmur, click, rub or gallop GI: soft, non-tender; bowel sounds normal; no masses,  no organomegaly Extremities: extremities normal, atraumatic, no cyanosis or edema Neurologic: Alert and oriented X 3, normal strength and tone. Normal symmetric reflexes. Normal coordination and gait  ECOG PERFORMANCE STATUS: 1 -  Symptomatic but completely ambulatory  Blood pressure 138/89, pulse 95, temperature 97.9 F (36.6 C), temperature source Oral, resp. rate 18, height 5\' 5"  (1.651 m), weight 148 lb 4.8 oz (67.268 kg).  LABORATORY DATA: Lab Results  Component Value Date   WBC 8.3 12/19/2012   HGB 13.9 12/19/2012   HCT 39.2 12/19/2012   MCV 93.1 12/19/2012   PLT 191 12/19/2012      Chemistry      Component Value Date/Time   NA 141 12/11/2012 1448   NA 141 06/02/2012 1611    K 3.8 12/11/2012 1448   K 3.9 06/02/2012 1611   CL 106 12/11/2012 1448   CL 108 06/02/2012 1611   CO2 26 12/11/2012 1448   CO2 24 06/02/2012 1611   BUN 15.5 12/11/2012 1448   BUN 13 06/02/2012 1611   CREATININE 1.0 12/11/2012 1448   CREATININE 0.65 06/02/2012 1611      Component Value Date/Time   CALCIUM 10.1 12/11/2012 1448   CALCIUM 9.6 06/02/2012 1611   ALKPHOS 75 12/11/2012 1448   AST 41* 12/11/2012 1448   ALT 51 12/11/2012 1448   BILITOT 0.59 12/11/2012 1448       RADIOGRAPHIC STUDIES: Ct Chest W Contrast  12/18/2012  *RADIOLOGY REPORT*  Clinical Data:  Lung cancer diagnosed in August 2013.  Chemotherapy in progress  CT CHEST, ABDOMEN AND PELVIS WITH CONTRAST  Technique:  Multidetector CT imaging of the chest, abdomen and pelvis was performed following the standard protocol during bolus administration of intravenous contrast.  Contrast: OMNIPAQUE IOHEXOL 300 MG/ML  SOLN  Comparison:   CT 11/ 02/2012, CT 09/26/2012.   CT CHEST  Findings:  No axillary or supraclavicular lymphadenopathy. No mediastinal lymphadenopathy.  There is nodular pleural parenchymal thickening at the inferior right middle lobe measuring 19 x 25 mm. This is decreased from 30 mm maximal diameter on prior.  There is a 7 mm nodule in the right upper lobe ( image 16).  This is decreased from 9 mm on prior. Other right upper lobe lesion seen on prior has since resolved. This is compared to CT of 08/15/2012.  In comparison to CT of 09/26/2012  no significant change.  IMPRESSION:  1.  Nodular mass like thickening at the right lung base is decreased in size compared to prior 08/15/2012 and unchanged from 09/26/2012.  2.  Decrease in size of right upper lobe pulmonary nodule. 3.  Other nodules in the right upper lobe are unchanged or resolved.   CT ABDOMEN AND PELVIS  Findings:  Low density lesion in the right hepatic lobe is simple cyst.  The gallbladder, pancreas, adrenal glands, and kidneys are normal.  Small bowel, appendix, and cecum are  normal.  The colon and rectosigmoid colon are normal.  Periportal lymphadenopathy.  Prostate gland is larger with nodular indentation into the bladder. There multiple enlarged bladder stones. There are bladder stones within a left lateral bladder diverticulum.  Review of  bone windows demonstrates no aggressive osseous lesions.  IMPRESSION:  1.  No evidence metastasis in the abdomen or pelvis. 2.  Bladder stones and stones  within a left bladder diverticulum. 3.  Nodular enlargement of the prostate gland.   Original Report Authenticated By: Genevive Bi, M.D.    ASSESSMENT: This is a very pleasant 67 years old Asian male with history of metastatic non-small cell lung cancer, adenocarcinoma with negative EGFR mutation, negative ALK gene translocation and negative ROS1  PLAN: The patient is doing fine today and his CT  scan of the chest, abdomen and pelvis showed no evidence for disease progression after the 3 cycles of maintenance chemotherapy with single agent Alimta. I discussed the scan results with the patient and his family. I recommended for him to resume his maintenance treatment with Alimta 500 mg/M2 every 3 weeks. He would start cycle #4 today as scheduled. He would come back for followup visit in 3 weeks with the next cycle of his chemotherapy. He was advised to call immediately if he has any concerning symptoms in the interval. The patient was advised to take his folic acid, Decadron and Compazine as prescribed.  All questions were answered. The patient knows to call the clinic with any problems, questions or concerns. We can certainly see the patient much sooner if necessary.  I spent 15 minutes counseling the patient face to face. The total time spent in the appointment was 25 minutes.

## 2012-12-19 NOTE — Telephone Encounter (Signed)
Gave pt appt for April 2014 lab and MD waiting for chemo

## 2012-12-20 ENCOUNTER — Telehealth: Payer: Self-pay | Admitting: *Deleted

## 2012-12-20 ENCOUNTER — Encounter: Payer: Self-pay | Admitting: *Deleted

## 2012-12-20 NOTE — Telephone Encounter (Signed)
Message copied by Augusto Garbe on Wed Dec 20, 2012  1:21 PM ------      Message from: Barbara Cower      Created: Tue Dec 19, 2012  1:13 PM       1st Alimta            Previously treated at [facility]. ------

## 2012-12-20 NOTE — Telephone Encounter (Signed)
Message left on voicemail requesting a return call.  Awaiting return call from patient.

## 2012-12-26 ENCOUNTER — Other Ambulatory Visit: Payer: Self-pay | Admitting: Lab

## 2012-12-26 ENCOUNTER — Other Ambulatory Visit: Payer: Self-pay

## 2013-01-02 ENCOUNTER — Other Ambulatory Visit: Payer: Self-pay | Admitting: Lab

## 2013-01-02 ENCOUNTER — Other Ambulatory Visit: Payer: Self-pay

## 2013-01-09 ENCOUNTER — Telehealth: Payer: Self-pay | Admitting: Internal Medicine

## 2013-01-09 ENCOUNTER — Ambulatory Visit (HOSPITAL_BASED_OUTPATIENT_CLINIC_OR_DEPARTMENT_OTHER): Payer: BC Managed Care – PPO | Admitting: Physician Assistant

## 2013-01-09 ENCOUNTER — Encounter: Payer: Self-pay | Admitting: Physician Assistant

## 2013-01-09 ENCOUNTER — Other Ambulatory Visit (HOSPITAL_BASED_OUTPATIENT_CLINIC_OR_DEPARTMENT_OTHER): Payer: BC Managed Care – PPO | Admitting: Lab

## 2013-01-09 ENCOUNTER — Ambulatory Visit (HOSPITAL_BASED_OUTPATIENT_CLINIC_OR_DEPARTMENT_OTHER): Payer: BC Managed Care – PPO

## 2013-01-09 DIAGNOSIS — C3490 Malignant neoplasm of unspecified part of unspecified bronchus or lung: Secondary | ICD-10-CM

## 2013-01-09 DIAGNOSIS — K5900 Constipation, unspecified: Secondary | ICD-10-CM

## 2013-01-09 DIAGNOSIS — Z5111 Encounter for antineoplastic chemotherapy: Secondary | ICD-10-CM

## 2013-01-09 DIAGNOSIS — C3430 Malignant neoplasm of lower lobe, unspecified bronchus or lung: Secondary | ICD-10-CM

## 2013-01-09 DIAGNOSIS — IMO0001 Reserved for inherently not codable concepts without codable children: Secondary | ICD-10-CM

## 2013-01-09 LAB — COMPREHENSIVE METABOLIC PANEL (CC13)
Albumin: 3.6 g/dL (ref 3.5–5.0)
Alkaline Phosphatase: 97 U/L (ref 40–150)
BUN: 19.3 mg/dL (ref 7.0–26.0)
CO2: 21 mEq/L — ABNORMAL LOW (ref 22–29)
Calcium: 10.1 mg/dL (ref 8.4–10.4)
Chloride: 106 mEq/L (ref 98–107)
Glucose: 131 mg/dl — ABNORMAL HIGH (ref 70–99)
Potassium: 4 mEq/L (ref 3.5–5.1)

## 2013-01-09 LAB — CBC WITH DIFFERENTIAL/PLATELET
Basophils Absolute: 0 10*3/uL (ref 0.0–0.1)
EOS%: 0 % (ref 0.0–7.0)
Eosinophils Absolute: 0 10*3/uL (ref 0.0–0.5)
HCT: 40.3 % (ref 38.4–49.9)
HGB: 14.1 g/dL (ref 13.0–17.1)
MCH: 32.1 pg (ref 27.2–33.4)
NEUT%: 87.2 % — ABNORMAL HIGH (ref 39.0–75.0)
lymph#: 1 10*3/uL (ref 0.9–3.3)

## 2013-01-09 MED ORDER — SODIUM CHLORIDE 0.9 % IV SOLN
500.0000 mg/m2 | Freq: Once | INTRAVENOUS | Status: AC
  Administered 2013-01-09: 875 mg via INTRAVENOUS
  Filled 2013-01-09: qty 35

## 2013-01-09 MED ORDER — ONDANSETRON 8 MG/50ML IVPB (CHCC)
8.0000 mg | Freq: Once | INTRAVENOUS | Status: AC
  Administered 2013-01-09: 8 mg via INTRAVENOUS

## 2013-01-09 MED ORDER — DEXAMETHASONE SODIUM PHOSPHATE 10 MG/ML IJ SOLN
10.0000 mg | Freq: Once | INTRAMUSCULAR | Status: AC
  Administered 2013-01-09: 10 mg via INTRAVENOUS

## 2013-01-09 MED ORDER — CYANOCOBALAMIN 1000 MCG/ML IJ SOLN
1000.0000 ug | Freq: Once | INTRAMUSCULAR | Status: AC
  Administered 2013-01-09: 1000 ug via INTRAMUSCULAR

## 2013-01-09 MED ORDER — SODIUM CHLORIDE 0.9 % IV SOLN
Freq: Once | INTRAVENOUS | Status: AC
  Administered 2013-01-09: 16:00:00 via INTRAVENOUS

## 2013-01-09 NOTE — Patient Instructions (Addendum)
Followup in 3 weeks prior to your next scheduled cycle of maintenance Alimta

## 2013-01-09 NOTE — Patient Instructions (Signed)
Patient aware of next appointment; discharged home with no complaints. 

## 2013-01-09 NOTE — Progress Notes (Signed)
[facility] [phone]   [phone]  OFFICE PROGRESS NOTE  [name], MD [address] [place] [address]  DIAGNOSIS: Metastatic non-small cell lung cancer favoring adenocarcinoma with negative EGFR mutation, negative ALK gene translocation and negative ROS1 diagnosed in August of 2013   PRIOR THERAPY: Status post 4 cycles of systemic chemotherapy with carboplatin and Alimta with partial response at [facility]. Last dose was given in November of 2013.   CURRENT THERAPY: Maintenance chemotherapy with Alimta 500 mg/M2 started on 09/26/2012, status post 3 cycles.   INTERVAL HISTORY: Thomas May 67 y.o. male returns to the clinic today for followup visit accompanied his son and an interpreter.  The patient is feeling fine today with no specific complaints except for some constipation that occurs approximately 3 days after chemotherapy and lasts for about a week. He is not getting any relief with constipation but taking one plane Senokot daily. He denied having any significant chest pain, shortness of breath, cough or hemoptysis. He denied having any weight loss or night sweats.  He feels that he was doing better and feeling better when he was being treated with the carboplatin and Alimta. He feels that it was more aggressive treatment and has questions as to why he is only being treated with 1 drug right now.  MEDICAL HISTORY: Past Medical History  Diagnosis Date  . Tobacco use   . Lung mass   . BPH (benign prostatic hyperplasia)     ALLERGIES:  has No Known Allergies.  MEDICATIONS:  Current Outpatient Prescriptions  Medication Sig Dispense Refill  . dexamethasone (DECADRON) 4 MG tablet 4 mg by mouth twice a day day before, day of and day after the chemotherapy every 3 weeks  40 tablet  0  . docusate sodium (COLACE) 100 MG capsule Take 100 mg by mouth 2 (two) times daily.      . folic acid (FOLVITE) 1 MG  tablet Take 1 tablet (1 mg total) by mouth daily.  30 tablet  2  . megestrol (MEGACE ORAL) 40 MG/ML suspension Take 20 mLs (800 mg total) by mouth daily.  480 mL  0  . Multiple Vitamin (MULTIVITAMIN) tablet Take 1 tablet by mouth daily.      . Naproxen Sodium (ALEVE PO) Take by mouth 2 (two) times daily.      . prochlorperazine (COMPAZINE) 10 MG tablet Take 5 mg by mouth every 6 (six) hours as needed. Take 0.5 tablets (5 mg total) by mouth every 6 (six) hours as needed for Nausea.      . Saw Palmetto, Serenoa repens, (SAW PALMETTO PO) Take 1 capsule by mouth daily.      Marland Kitchen terazosin (HYTRIN) 1 MG capsule Take 1 capsule (1 mg total) by mouth at bedtime.  30 capsule  3   No current facility-administered medications for this visit.    SURGICAL HISTORY:  Past Surgical History  Procedure Laterality Date  . Video bronchoscopy  05/31/2012    Procedure: VIDEO BRONCHOSCOPY WITH FLUORO;  Surgeon: Nyoka Cowden, MD;  Location: [facility];  Service: Endoscopy;  Laterality: Bilateral;    REVIEW OF SYSTEMS:  A comprehensive review of systems was negative.   PHYSICAL EXAMINATION: General appearance: alert, cooperative and no distress Head: Normocephalic, without obvious abnormality, atraumatic Neck: no adenopathy Lymph nodes: Cervical, supraclavicular, and axillary nodes normal. Resp: clear to auscultation bilaterally Cardio: regular rate and rhythm, S1, S2 normal, no murmur,  click, rub or gallop GI: soft, non-tender; bowel sounds normal; no masses,  no organomegaly Extremities: extremities normal, atraumatic, no cyanosis or edema Neurologic: Alert and oriented X 3, normal strength and tone. Normal symmetric reflexes. Normal coordination and gait  ECOG PERFORMANCE STATUS: 1 - Symptomatic but completely ambulatory  Blood pressure 153/99, pulse 76, temperature 97.6 F (36.4 C), temperature source Tympanic, resp. rate 18, height 5\' 5"  (1.651 m), weight 149 lb (67.586 kg).  LABORATORY DATA: Lab  Results  Component Value Date   WBC 9.5 01/09/2013   HGB 14.1 01/09/2013   HCT 40.3 01/09/2013   MCV 91.8 01/09/2013   PLT 260 01/09/2013      Chemistry      Component Value Date/Time   NA 138 01/09/2013 1423   NA 141 06/02/2012 1611   K 4.0 01/09/2013 1423   K 3.9 06/02/2012 1611   CL 106 01/09/2013 1423   CL 108 06/02/2012 1611   CO2 21* 01/09/2013 1423   CO2 24 06/02/2012 1611   BUN 19.3 01/09/2013 1423   BUN 13 06/02/2012 1611   CREATININE 1.0 01/09/2013 1423   CREATININE 0.65 06/02/2012 1611      Component Value Date/Time   CALCIUM 10.1 01/09/2013 1423   CALCIUM 9.6 06/02/2012 1611   ALKPHOS 97 01/09/2013 1423   AST 40* 01/09/2013 1423   ALT 72* 01/09/2013 1423   BILITOT 0.57 01/09/2013 1423       RADIOGRAPHIC STUDIES: Ct Chest W Contrast  12/18/2012  *RADIOLOGY REPORT*  Clinical Data:  Lung cancer diagnosed in August 2013.  Chemotherapy in progress  CT CHEST, ABDOMEN AND PELVIS WITH CONTRAST  Technique:  Multidetector CT imaging of the chest, abdomen and pelvis was performed following the standard protocol during bolus administration of intravenous contrast.  Contrast: OMNIPAQUE IOHEXOL 300 MG/ML  SOLN  Comparison:   CT 11/ 02/2012, CT 09/26/2012.   CT CHEST  Findings:  No axillary or supraclavicular lymphadenopathy. No mediastinal lymphadenopathy.  There is nodular pleural parenchymal thickening at the inferior right middle lobe measuring 19 x 25 mm. This is decreased from 30 mm maximal diameter on prior.  There is a 7 mm nodule in the right upper lobe ( image 16).  This is decreased from 9 mm on prior. Other right upper lobe lesion seen on prior has since resolved. This is compared to CT of 08/15/2012.  In comparison to CT of 09/26/2012  no significant change.  IMPRESSION:  1.  Nodular mass like thickening at the right lung base is decreased in size compared to prior 08/15/2012 and unchanged from 09/26/2012.  2.  Decrease in size of right upper lobe pulmonary nodule. 3.  Other nodules in the right  upper lobe are unchanged or resolved.   CT ABDOMEN AND PELVIS  Findings:  Low density lesion in the right hepatic lobe is simple cyst.  The gallbladder, pancreas, adrenal glands, and kidneys are normal.  Small bowel, appendix, and cecum are normal.  The colon and rectosigmoid colon are normal.  Periportal lymphadenopathy.  Prostate gland is larger with nodular indentation into the bladder. There multiple enlarged bladder stones. There are bladder stones within a left lateral bladder diverticulum.  Review of  bone windows demonstrates no aggressive osseous lesions.  IMPRESSION:  1.  No evidence metastasis in the abdomen or pelvis. 2.  Bladder stones and stones  within a left bladder diverticulum. 3.  Nodular enlargement of the prostate gland.   Original Report Authenticated By: Genevive Bi, M.D.  ASSESSMENT/PLAN: This is a very pleasant 67 years old Asian male with history of metastatic non-small cell lung cancer, adenocarcinoma with negative EGFR mutation, negative ALK gene translocation and negative ROS1. He is status post 4 cycles of maintenance single agent Alimta at 500 mg per meter squared given every 3 weeks. Patient was discussed with Dr. Arbutus Ped. He will proceed with cycle #5 of his maintenance chemotherapy with single agent Alimta at 500 mg region squared given her 3 weeks. A long discussion was had with the patient his son with the help of the interpreter regarding that the patient completed 4 cycles of doublet therapy with carboplatin and Alimta and was deemed to have stable disease such that he could transition to maintenance chemotherapy. Patient voiced understanding. For his constipation Avastin patient is changed to Senokot S1 to 2 tablets twice daily and titrate as needed for relief of constipation. He is also to drink plenty of water to help relieve his constipation. Again the patient voiced understanding of these instructions. He'll return in 3 weeks prior to cycle #6 with repeat CBC  differential and C. met.  Laural Benes, Sven Pinheiro E, PA-C   He was advised to call immediately if he has any concerning symptoms in the interval. The patient was advised to take his folic acid, Decadron and Compazine as prescribed.  All questions were answered. The patient knows to call the clinic with any problems, questions or concerns. We can certainly see the patient much sooner if necessary.  I spent 20 minutes counseling the patient face to face. The total time spent in the appointment was 30 minutes.

## 2013-01-16 ENCOUNTER — Other Ambulatory Visit: Payer: Self-pay | Admitting: Lab

## 2013-01-16 ENCOUNTER — Other Ambulatory Visit: Payer: Self-pay

## 2013-01-23 ENCOUNTER — Other Ambulatory Visit: Payer: Self-pay | Admitting: Lab

## 2013-01-23 ENCOUNTER — Other Ambulatory Visit: Payer: Self-pay

## 2013-01-29 ENCOUNTER — Encounter: Payer: Self-pay | Admitting: Lab

## 2013-01-30 ENCOUNTER — Other Ambulatory Visit (HOSPITAL_BASED_OUTPATIENT_CLINIC_OR_DEPARTMENT_OTHER): Payer: BC Managed Care – PPO | Admitting: Lab

## 2013-01-30 ENCOUNTER — Ambulatory Visit: Payer: BC Managed Care – PPO | Admitting: Physician Assistant

## 2013-01-30 ENCOUNTER — Ambulatory Visit (HOSPITAL_BASED_OUTPATIENT_CLINIC_OR_DEPARTMENT_OTHER): Payer: BC Managed Care – PPO

## 2013-01-30 ENCOUNTER — Encounter: Payer: Self-pay | Admitting: Physician Assistant

## 2013-01-30 ENCOUNTER — Telehealth: Payer: Self-pay | Admitting: Internal Medicine

## 2013-01-30 ENCOUNTER — Other Ambulatory Visit: Payer: BC Managed Care – PPO | Admitting: Lab

## 2013-01-30 ENCOUNTER — Telehealth: Payer: Self-pay | Admitting: *Deleted

## 2013-01-30 ENCOUNTER — Ambulatory Visit (HOSPITAL_BASED_OUTPATIENT_CLINIC_OR_DEPARTMENT_OTHER): Payer: BC Managed Care – PPO | Admitting: Physician Assistant

## 2013-01-30 DIAGNOSIS — C3430 Malignant neoplasm of lower lobe, unspecified bronchus or lung: Secondary | ICD-10-CM

## 2013-01-30 DIAGNOSIS — C3490 Malignant neoplasm of unspecified part of unspecified bronchus or lung: Secondary | ICD-10-CM

## 2013-01-30 DIAGNOSIS — R141 Gas pain: Secondary | ICD-10-CM

## 2013-01-30 DIAGNOSIS — K5900 Constipation, unspecified: Secondary | ICD-10-CM

## 2013-01-30 DIAGNOSIS — Z5111 Encounter for antineoplastic chemotherapy: Secondary | ICD-10-CM

## 2013-01-30 LAB — COMPREHENSIVE METABOLIC PANEL (CC13)
ALT: 89 U/L — ABNORMAL HIGH (ref 0–55)
AST: 59 U/L — ABNORMAL HIGH (ref 5–34)
Albumin: 3.6 g/dL (ref 3.5–5.0)
Alkaline Phosphatase: 102 U/L (ref 40–150)
Potassium: 3.8 mEq/L (ref 3.5–5.1)
Sodium: 140 mEq/L (ref 136–145)
Total Protein: 8.2 g/dL (ref 6.4–8.3)

## 2013-01-30 LAB — CBC WITH DIFFERENTIAL/PLATELET
EOS%: 0 % (ref 0.0–7.0)
MCH: 32.3 pg (ref 27.2–33.4)
MCV: 91.5 fL (ref 79.3–98.0)
MONO%: 0.7 % (ref 0.0–14.0)
NEUT#: 5.1 10*3/uL (ref 1.5–6.5)
RBC: 4.61 10*6/uL (ref 4.20–5.82)
RDW: 13.7 % (ref 11.0–14.6)

## 2013-01-30 MED ORDER — SODIUM CHLORIDE 0.9 % IV SOLN
500.0000 mg/m2 | Freq: Once | INTRAVENOUS | Status: AC
  Administered 2013-01-30: 875 mg via INTRAVENOUS
  Filled 2013-01-30: qty 35

## 2013-01-30 MED ORDER — ONDANSETRON 8 MG/50ML IVPB (CHCC)
8.0000 mg | Freq: Once | INTRAVENOUS | Status: AC
  Administered 2013-01-30: 8 mg via INTRAVENOUS

## 2013-01-30 MED ORDER — SODIUM CHLORIDE 0.9 % IV SOLN
Freq: Once | INTRAVENOUS | Status: AC
  Administered 2013-01-30: 15:00:00 via INTRAVENOUS

## 2013-01-30 MED ORDER — DEXAMETHASONE SODIUM PHOSPHATE 10 MG/ML IJ SOLN
10.0000 mg | Freq: Once | INTRAMUSCULAR | Status: AC
  Administered 2013-01-30: 10 mg via INTRAVENOUS

## 2013-01-30 NOTE — Telephone Encounter (Signed)
gve the pt's dtr the may 2014 appt calendar' sent michelle a staff message to add the chemo appt.

## 2013-01-30 NOTE — Patient Instructions (Addendum)
Followup with Dr. Arbutus Ped in 3 weeks with restaging CT scan of your chest to reevaluate your disease prior to your next scheduled cycle of maintenance Alimta

## 2013-01-30 NOTE — Patient Instructions (Signed)
[facility] Discharge Instructions for Patients Receiving Chemotherapy  Today you received the following chemotherapy agents Alimta To help prevent nausea and vomiting after your treatment, we encourage you to take your nausea medication as prescribed.  If you develop nausea and vomiting that is not controlled by your nausea medication, call the clinic. If it is after clinic hours your family physician or the after hours number for the clinic or go to the Emergency Department.   BELOW ARE SYMPTOMS THAT SHOULD BE REPORTED IMMEDIATELY:  *FEVER GREATER THAN 100.5 F  *CHILLS WITH OR WITHOUT FEVER  NAUSEA AND VOMITING THAT IS NOT CONTROLLED WITH YOUR NAUSEA MEDICATION  *UNUSUAL SHORTNESS OF BREATH  *UNUSUAL BRUISING OR BLEEDING  TENDERNESS IN MOUTH AND THROAT WITH OR WITHOUT PRESENCE OF ULCERS  *URINARY PROBLEMS  *BOWEL PROBLEMS  UNUSUAL RASH Items with * indicate a potential emergency and should be followed up as soon as possible.  One of the nurses will contact you 24 hours after your treatment. Please let the nurse know about any problems that you may have experienced. Feel free to call the clinic you have any questions or concerns. The clinic phone number is [phone].   I have been informed and understand all the instructions given to me. I know to contact the clinic, my physician, or go to the Emergency Department if any problems should occur. I do not have any questions at this time, but understand that I may call the clinic during office hours   should I have any questions or need assistance in obtaining follow up care.    __________________________________________  _____________  __________ Signature of Patient or Authorized Representative            Date                   Time    __________________________________________ Nurse's Signature    

## 2013-01-30 NOTE — Telephone Encounter (Signed)
Per staff message and POF I have scheduled appts.  JMW  

## 2013-02-02 NOTE — Progress Notes (Signed)
[facility] [phone]   Fax:([phone]  OFFICE PROGRESS NOTE  [name], MD [address] [place] [address]  DIAGNOSIS: Metastatic non-small cell lung cancer favoring adenocarcinoma with negative EGFR mutation, negative ALK gene translocation and negative ROS1 diagnosed in August of 2013   PRIOR THERAPY: Status post 4 cycles of systemic chemotherapy with carboplatin and Alimta with partial response at [facility]. Last dose was given in November of 2013.   CURRENT THERAPY: Maintenance chemotherapy with Alimta 500 mg/M2 started on 09/26/2012, status post 2 cycles.   INTERVAL HISTORY: Thomas May 67 y.o. male returns to the clinic today for followup visit accompanied his daughter and an interpreter.  The patient is feeling fine today with no specific complaints except for continued constipation that occurs approximately 3 days after chemotherapy and lasts for about a week. He also notes increased bloating. He was usually takes a magnesium citrate and ultimately at some relief from constipation. His daughter's states that she is also been giving him been a 5 her and FiberCon. He does not drink much in the way of fluids.  He denied having any significant chest pain, shortness of breath, cough or hemoptysis. He denied having any weight loss or night sweats.    MEDICAL HISTORY: Past Medical History  Diagnosis Date  . Tobacco use   . Lung mass   . BPH (benign prostatic hyperplasia)     ALLERGIES:  has No Known Allergies.  MEDICATIONS:  Current Outpatient Prescriptions  Medication Sig Dispense Refill  . dexamethasone (DECADRON) 4 MG tablet 4 mg by mouth twice a day day before, day of and day after the chemotherapy every 3 weeks  40 tablet  0  . docusate sodium (COLACE) 100 MG capsule Take 100 mg by mouth 2 (two) times daily.      . folic acid (FOLVITE) 1 MG tablet Take 1 tablet (1 mg total) by mouth daily.  30 tablet   2  . megestrol (MEGACE ORAL) 40 MG/ML suspension Take 20 mLs (800 mg total) by mouth daily.  480 mL  0  . Multiple Vitamin (MULTIVITAMIN) tablet Take 1 tablet by mouth daily.      . prochlorperazine (COMPAZINE) 10 MG tablet Take 5 mg by mouth every 6 (six) hours as needed. Take 0.5 tablets (5 mg total) by mouth every 6 (six) hours as needed for Nausea.      . Saw Palmetto, Serenoa repens, (SAW PALMETTO PO) Take 1 capsule by mouth daily.      Marland Kitchen terazosin (HYTRIN) 1 MG capsule Take 1 capsule (1 mg total) by mouth at bedtime.  30 capsule  3   No current facility-administered medications for this visit.    SURGICAL HISTORY:  Past Surgical History  Procedure Laterality Date  . Video bronchoscopy  05/31/2012    Procedure: VIDEO BRONCHOSCOPY WITH FLUORO;  Surgeon: Nyoka Cowden, MD;  Location: [facility];  Service: Endoscopy;  Laterality: Bilateral;    REVIEW OF SYSTEMS:  A comprehensive review of systems was negative.   PHYSICAL EXAMINATION: General appearance: alert, cooperative and no distress Head: Normocephalic, without obvious abnormality, atraumatic Neck: no adenopathy Lymph nodes: Cervical, supraclavicular, and axillary nodes normal. Resp: clear to auscultation bilaterally Cardio: regular rate and rhythm, S1, S2 normal, no murmur, click, rub or gallop GI: soft, non-tender; bowel sounds normal; no masses,  no organomegaly Extremities: extremities normal, atraumatic, no cyanosis or edema Neurologic: Alert and oriented X  3, normal strength and tone. Normal symmetric reflexes. Normal coordination and gait  ECOG PERFORMANCE STATUS: 1 - Symptomatic but completely ambulatory  Blood pressure 145/99, pulse 105, temperature 98.2 F (36.8 C), temperature source Oral, resp. rate 18, height 5\' 5"  (1.651 m), weight 146 lb 6.4 oz (66.407 kg).  LABORATORY DATA: Lab Results  Component Value Date   WBC 5.9 01/30/2013   HGB 14.9 01/30/2013   HCT 42.2 01/30/2013   MCV 91.5 01/30/2013   PLT 257  01/30/2013      Chemistry      Component Value Date/Time   NA 140 01/30/2013 1402   NA 141 06/02/2012 1611   K 3.8 01/30/2013 1402   K 3.9 06/02/2012 1611   CL 109* 01/30/2013 1402   CL 108 06/02/2012 1611   CO2 22 01/30/2013 1402   CO2 24 06/02/2012 1611   BUN 17.1 01/30/2013 1402   BUN 13 06/02/2012 1611   CREATININE 1.0 01/30/2013 1402   CREATININE 0.65 06/02/2012 1611      Component Value Date/Time   CALCIUM 10.1 01/30/2013 1402   CALCIUM 9.6 06/02/2012 1611   ALKPHOS 102 01/30/2013 1402   AST 59* 01/30/2013 1402   ALT 89* 01/30/2013 1402   BILITOT 0.54 01/30/2013 1402       RADIOGRAPHIC STUDIES: Ct Chest W Contrast  12/18/2012  *RADIOLOGY REPORT*  Clinical Data:  Lung cancer diagnosed in August 2013.  Chemotherapy in progress  CT CHEST, ABDOMEN AND PELVIS WITH CONTRAST  Technique:  Multidetector CT imaging of the chest, abdomen and pelvis was performed following the standard protocol during bolus administration of intravenous contrast.  Contrast: OMNIPAQUE IOHEXOL 300 MG/ML  SOLN  Comparison:   CT 11/ 02/2012, CT 09/26/2012.   CT CHEST  Findings:  No axillary or supraclavicular lymphadenopathy. No mediastinal lymphadenopathy.  There is nodular pleural parenchymal thickening at the inferior right middle lobe measuring 19 x 25 mm. This is decreased from 30 mm maximal diameter on prior.  There is a 7 mm nodule in the right upper lobe ( image 16).  This is decreased from 9 mm on prior. Other right upper lobe lesion seen on prior has since resolved. This is compared to CT of 08/15/2012.  In comparison to CT of 09/26/2012  no significant change.  IMPRESSION:  1.  Nodular mass like thickening at the right lung base is decreased in size compared to prior 08/15/2012 and unchanged from 09/26/2012.  2.  Decrease in size of right upper lobe pulmonary nodule. 3.  Other nodules in the right upper lobe are unchanged or resolved.   CT ABDOMEN AND PELVIS  Findings:  Low density lesion in the right hepatic  lobe is simple cyst.  The gallbladder, pancreas, adrenal glands, and kidneys are normal.  Small bowel, appendix, and cecum are normal.  The colon and rectosigmoid colon are normal.  Periportal lymphadenopathy.  Prostate gland is larger with nodular indentation into the bladder. There multiple enlarged bladder stones. There are bladder stones within a left lateral bladder diverticulum.  Review of  bone windows demonstrates no aggressive osseous lesions.  IMPRESSION:  1.  No evidence metastasis in the abdomen or pelvis. 2.  Bladder stones and stones  within a left bladder diverticulum. 3.  Nodular enlargement of the prostate gland.   Original Report Authenticated By: Genevive Bi, M.D.    ASSESSMENT/PLAN: This is a very pleasant 67 years old Asian male with history of metastatic non-small cell lung cancer, adenocarcinoma with negative EGFR mutation,  negative ALK gene translocation and negative ROS1. He is status post 2 cycles of maintenance single agent Alimta at 500 mg per meter squared given every 3 weeks. Patient was discussed with Dr. Arbutus Ped. He will proceed with cycle #3 of his maintenance chemotherapy with single agent Alimta at 500 mg region squared given her 3 weeks. For his constipation the patient was advised to take Senokot S, 2 tablets twice daily and titrate as needed for relief of constipation. He is also to significantly increase his intake of water to help relieve his constipation. The patient voiced understanding of these instructions. He'll followup with Dr. Arbutus Ped in 3 weeks with repeat CBC differential, C. met and CT of the chest with contrast to reevaluate his disease.   Thomas May, Thomas Galentine E, PA-C   He was advised to call immediately if he has any concerning symptoms in the interval. The patient was advised to continue to take his folic acid, Decadron and Compazine as prescribed.  All questions were answered. The patient knows to call the clinic with any problems, questions or  concerns. We can certainly see the patient much sooner if necessary.  I spent 20 minutes counseling the patient face to face. The total time spent in the appointment was 30 minutes.

## 2013-02-16 ENCOUNTER — Ambulatory Visit (HOSPITAL_COMMUNITY)
Admission: RE | Admit: 2013-02-16 | Discharge: 2013-02-16 | Disposition: A | Discharge status: Home / Self Care | Payer: BC Managed Care – PPO | Source: Ambulatory Visit | Attending: Physician Assistant | Admitting: Physician Assistant

## 2013-02-16 DIAGNOSIS — I7090 Unspecified atherosclerosis: Secondary | ICD-10-CM | POA: Insufficient documentation

## 2013-02-16 DIAGNOSIS — Z79899 Other long term (current) drug therapy: Secondary | ICD-10-CM | POA: Insufficient documentation

## 2013-02-16 DIAGNOSIS — I2510 Atherosclerotic heart disease of native coronary artery without angina pectoris: Secondary | ICD-10-CM | POA: Insufficient documentation

## 2013-02-16 DIAGNOSIS — R911 Solitary pulmonary nodule: Secondary | ICD-10-CM | POA: Insufficient documentation

## 2013-02-16 DIAGNOSIS — C3490 Malignant neoplasm of unspecified part of unspecified bronchus or lung: Secondary | ICD-10-CM | POA: Insufficient documentation

## 2013-02-16 MED ORDER — IOHEXOL 300 MG/ML  SOLN
80.0000 mL | Freq: Once | INTRAMUSCULAR | Status: AC | PRN
  Administered 2013-02-16: 80 mL via INTRAVENOUS

## 2013-02-19 ENCOUNTER — Ambulatory Visit (HOSPITAL_BASED_OUTPATIENT_CLINIC_OR_DEPARTMENT_OTHER): Payer: BC Managed Care – PPO

## 2013-02-19 ENCOUNTER — Ambulatory Visit (HOSPITAL_BASED_OUTPATIENT_CLINIC_OR_DEPARTMENT_OTHER): Payer: BC Managed Care – PPO | Admitting: Internal Medicine

## 2013-02-19 ENCOUNTER — Telehealth: Payer: Self-pay | Admitting: Internal Medicine

## 2013-02-19 ENCOUNTER — Encounter: Payer: Self-pay | Admitting: Internal Medicine

## 2013-02-19 ENCOUNTER — Other Ambulatory Visit (HOSPITAL_BASED_OUTPATIENT_CLINIC_OR_DEPARTMENT_OTHER): Payer: BC Managed Care – PPO | Admitting: Lab

## 2013-02-19 DIAGNOSIS — C3430 Malignant neoplasm of lower lobe, unspecified bronchus or lung: Secondary | ICD-10-CM

## 2013-02-19 DIAGNOSIS — Z5111 Encounter for antineoplastic chemotherapy: Secondary | ICD-10-CM

## 2013-02-19 LAB — COMPREHENSIVE METABOLIC PANEL (CC13)
ALT: 62 U/L — ABNORMAL HIGH (ref 0–55)
Albumin: 3.5 g/dL (ref 3.5–5.0)
CO2: 23 mEq/L (ref 22–29)
Calcium: 9.6 mg/dL (ref 8.4–10.4)
Chloride: 109 mEq/L — ABNORMAL HIGH (ref 98–107)
Creatinine: 1 mg/dL (ref 0.7–1.3)
Potassium: 3.5 mEq/L (ref 3.5–5.1)

## 2013-02-19 LAB — CBC WITH DIFFERENTIAL/PLATELET
Eosinophils Absolute: 0.1 10*3/uL (ref 0.0–0.5)
HCT: 38.1 % — ABNORMAL LOW (ref 38.4–49.9)
LYMPH%: 31.8 % (ref 14.0–49.0)
MONO#: 0.6 10*3/uL (ref 0.1–0.9)
NEUT#: 3.9 10*3/uL (ref 1.5–6.5)
NEUT%: 57 % (ref 39.0–75.0)
Platelets: 255 10*3/uL (ref 140–400)
RBC: 4.17 10*6/uL — ABNORMAL LOW (ref 4.20–5.82)
WBC: 6.9 10*3/uL (ref 4.0–10.3)
nRBC: 0 % (ref 0–0)

## 2013-02-19 MED ORDER — DEXAMETHASONE SODIUM PHOSPHATE 10 MG/ML IJ SOLN
10.0000 mg | Freq: Once | INTRAMUSCULAR | Status: AC
  Administered 2013-02-19: 10 mg via INTRAVENOUS

## 2013-02-19 MED ORDER — SODIUM CHLORIDE 0.9 % IV SOLN
500.0000 mg/m2 | Freq: Once | INTRAVENOUS | Status: AC
  Administered 2013-02-19: 875 mg via INTRAVENOUS
  Filled 2013-02-19: qty 35

## 2013-02-19 MED ORDER — ONDANSETRON 8 MG/50ML IVPB (CHCC)
8.0000 mg | Freq: Once | INTRAVENOUS | Status: AC
  Administered 2013-02-19: 8 mg via INTRAVENOUS

## 2013-02-19 MED ORDER — SODIUM CHLORIDE 0.9 % IV SOLN
Freq: Once | INTRAVENOUS | Status: AC
  Administered 2013-02-19: 15:00:00 via INTRAVENOUS

## 2013-02-19 NOTE — Patient Instructions (Addendum)
[facility] Discharge Instructions for Patients Receiving Chemotherapy  Today you received the following chemotherapy agents Alimta.  To help prevent nausea and vomiting after your treatment, we encourage you to take your nausea medication as prescribed.   If you develop nausea and vomiting that is not controlled by your nausea medication, call the clinic. If it is after clinic hours your family physician or the after hours number for the clinic or go to the Emergency Department.   BELOW ARE SYMPTOMS THAT SHOULD BE REPORTED IMMEDIATELY:  *FEVER GREATER THAN 100.5 F  *CHILLS WITH OR WITHOUT FEVER  NAUSEA AND VOMITING THAT IS NOT CONTROLLED WITH YOUR NAUSEA MEDICATION  *UNUSUAL SHORTNESS OF BREATH  *UNUSUAL BRUISING OR BLEEDING  TENDERNESS IN MOUTH AND THROAT WITH OR WITHOUT PRESENCE OF ULCERS  *URINARY PROBLEMS  *BOWEL PROBLEMS  UNUSUAL RASH Items with * indicate a potential emergency and should be followed up as soon as possible.  Feel free to call the clinic you have any questions or concerns. The clinic phone number is [phone].   I have been informed and understand all the instructions given to me. I know to contact the clinic, my physician, or go to the Emergency Department if any problems should occur. I do not have any questions at this time, but understand that I may call the clinic during office hours   should I have any questions or need assistance in obtaining follow up care.    __________________________________________  _____________  __________ Signature of Patient or Authorized Representative            Date                   Time    __________________________________________ Nurse's Signature    

## 2013-02-19 NOTE — Patient Instructions (Signed)
No evidence for disease progression on his recent scan. Continue maintenance treatment with Alimta. Followup visit in 3 weeks with the next cycle of chemotherapy.

## 2013-02-19 NOTE — Progress Notes (Signed)
[facility] [phone]   [phone]  OFFICE PROGRESS NOTE  Thomas Cotta, MD [address] [place] [address]  DIAGNOSIS: Metastatic non-small cell lung cancer favoring adenocarcinoma with negative EGFR mutation, negative ALK gene translocation and negative ROS1 diagnosed in August of 2013   PRIOR THERAPY: Status post 4 cycles of systemic chemotherapy with carboplatin and Alimta with partial response at [facility]. Last dose was given in November of 2013.   CURRENT THERAPY: Maintenance chemotherapy with Alimta 500 mg/M2 started on 09/26/2012, status post 3 cycles.    INTERVAL HISTORY: Thomas May 67 y.o. male returns to the clinic today for followup visit accompanied by his daughter and his interpreter. The patient is feeling fine today with no specific complaints. He is tolerating his systemic chemotherapy fairly well except for a few days of fatigue after each cycle. He denied having any significant nausea or vomiting. He denied having any peripheral neuropathy. The patient has no significant chest pain, shortness of breath, cough or hemoptysis. He had repeat CT scan of the chest performed recently and he is here for evaluation and discussion of his scan results.  MEDICAL HISTORY: Past Medical History  Diagnosis Date  . Tobacco use   . Lung mass   . BPH (benign prostatic hyperplasia)     ALLERGIES:  has No Known Allergies.  MEDICATIONS:  Current Outpatient Prescriptions  Medication Sig Dispense Refill  . dexamethasone (DECADRON) 4 MG tablet 4 mg by mouth twice a day day before, day of and day after the chemotherapy every 3 weeks  40 tablet  0  . docusate sodium (COLACE) 100 MG capsule Take 100 mg by mouth 2 (two) times daily.      . folic acid (FOLVITE) 1 MG tablet Take 1 tablet (1 mg total) by mouth daily.  30 tablet  2  . megestrol (MEGACE ORAL) 40 MG/ML suspension Take 20 mLs (800 mg total) by mouth  daily.  480 mL  0  . Multiple Vitamin (MULTIVITAMIN) tablet Take 1 tablet by mouth daily.      . Saw Palmetto, Serenoa repens, (SAW PALMETTO PO) Take 1 capsule by mouth daily.      Marland Kitchen terazosin (HYTRIN) 1 MG capsule Take 1 capsule (1 mg total) by mouth at bedtime.  30 capsule  3  . prochlorperazine (COMPAZINE) 10 MG tablet Take 5 mg by mouth every 6 (six) hours as needed. Take 0.5 tablets (5 mg total) by mouth every 6 (six) hours as needed for Nausea.       No current facility-administered medications for this visit.    SURGICAL HISTORY:  Past Surgical History  Procedure Laterality Date  . Video bronchoscopy  05/31/2012    Procedure: VIDEO BRONCHOSCOPY WITH FLUORO;  Surgeon: Nyoka Cowden, MD;  Location: [facility];  Service: Endoscopy;  Laterality: Bilateral;    REVIEW OF SYSTEMS:  A comprehensive review of systems was negative except for: Constitutional: positive for fatigue   PHYSICAL EXAMINATION: General appearance: alert, cooperative, fatigued and no distress Head: Normocephalic, without obvious abnormality, atraumatic Neck: no adenopathy Lymph nodes: Cervical, supraclavicular, and axillary nodes normal. Resp: clear to auscultation bilaterally Cardio: regular rate and rhythm, S1, S2 normal, no murmur, click, rub or gallop GI: soft, non-tender; bowel sounds normal; no masses,  no organomegaly Extremities: extremities normal, atraumatic, no cyanosis or edema Neurologic: Alert and oriented X 3, normal strength and tone. Normal symmetric reflexes. Normal coordination and gait  ECOG PERFORMANCE STATUS: 1 - Symptomatic but completely ambulatory  Blood pressure 148/97, pulse 91, temperature 98.1 F (36.7 C), temperature source Oral, resp. rate 18, height 5\' 5"  (1.651 m), weight 145 lb 9.6 oz (66.044 kg).  LABORATORY DATA: Lab Results  Component Value Date   WBC 6.9 02/19/2013   HGB 13.5 02/19/2013   HCT 38.1* 02/19/2013   MCV 91.4 02/19/2013   PLT 255 02/19/2013      Chemistry        Component Value Date/Time   NA 141 02/19/2013 1314   NA 141 06/02/2012 1611   K 3.5 02/19/2013 1314   K 3.9 06/02/2012 1611   CL 109* 02/19/2013 1314   CL 108 06/02/2012 1611   CO2 23 02/19/2013 1314   CO2 24 06/02/2012 1611   BUN 18.3 02/19/2013 1314   BUN 13 06/02/2012 1611   CREATININE 1.0 02/19/2013 1314   CREATININE 0.65 06/02/2012 1611      Component Value Date/Time   CALCIUM 9.6 02/19/2013 1314   CALCIUM 9.6 06/02/2012 1611   ALKPHOS 88 02/19/2013 1314   AST 43* 02/19/2013 1314   ALT 62* 02/19/2013 1314   BILITOT 0.63 02/19/2013 1314       RADIOGRAPHIC STUDIES: Ct Chest W Contrast  02/16/2013  *RADIOLOGY REPORT*  Clinical Data: Restaging metastatic non-small cell lung cancer. Chemotherapy in progress.  CT CHEST WITH CONTRAST  Technique:  Multidetector CT imaging of the chest was performed following the standard protocol during bolus administration of intravenous contrast.  Contrast: 80mL OMNIPAQUE IOHEXOL 300 MG/ML  SOLN  Comparison: Chest CT 12/18/2012.  Findings:  Mediastinum: Heart size is borderline enlarged. There is no significant pericardial fluid, thickening or pericardial calcification. There is atherosclerosis of the thoracic aorta, the great vessels of the mediastinum and the coronary arteries, including calcified atherosclerotic plaque in the left anterior descending, circumflex and right coronary arteries. No pathologically enlarged mediastinal or hilar lymph nodes. Esophagus is unremarkable in appearance.  Images 27 of series 2 and 5 demonstrate a small area of nodular soft tissue prominence in the proximal bronchus intermedius which is new compared to the prior examination.  Lungs/Pleura: Previously noted right middle lobe nodule has decreased slightly in size, currently measuring 1.6 x 2.3 cm.  This continues have microlobulated slightly spiculated margins with retraction of the overlying pleura.  Surrounding architectural distortion and a tiny 3 mm satellite nodule (image 34 of  series five) are unchanged.  There are a few other ill-defined pulmonary nodules and areas of ground-glass attenuation with a architectural distortions scattered throughout the lungs bilaterally, however, these are favored to be related to post infectious or inflammatory scarring as they are significantly decreased in size compared to the original prior examination 05/17/2012.  Alternatively, these could represent treated metastases.  No acute consolidative airspace disease.  No pleural effusions.  Upper Abdomen: 1.1 cm low attenuation lesion in the right lobe of the liver is unchanged and although too small to definitively characterize, is favored to represent a small cyst.  Musculoskeletal: There are no aggressive appearing lytic or blastic lesions noted in the visualized portions of the skeleton.  Old healed fracture of the posterolateral aspect of the right sixth rib is incidentally noted.  IMPRESSION: 1.  Slight interval decrease in size of the primary right middle lobe pulmonary nodule compared to the prior examination suggesting a positive response to therapy. 2.  Other scattered small ill-defined nodules and areas of ground- glass attenuation and architectural distortion, less apparent than prior examinations,  likely to represent evolving areas of post infectious or inflammatory scarring, or less likely may represent treated metastatic disease. 3.  However, today's study demonstrates a subtle new area of nodular soft tissue prominence along the lateral aspect of the proximal bronchus intermedius.  Whether not this represents retained mucus or other debris within the bronchus, or malignant soft tissue is uncertain.  At the very least, close attention on future follow up studies is recommended.  Alternatively, this could be further evaluated with bronchoscopy if clinically indicated. 4. Atherosclerosis, including two-vessel coronary artery disease. Assessment for potential risk factor modification, dietary  therapy or pharmacologic therapy may be warranted, if clinically indicated.   Original Report Authenticated By: Trudie Reed, M.D.     ASSESSMENT: This is a very pleasant 67 years old Asian male with metastatic non-small cell lung cancer currently on maintenance chemotherapy with single agent Alimta status post 3 cycles. The patient is tolerating his treatment fairly well and he has no evidence for disease progression on his recent scan.   PLAN: I discussed the scan results and showed the images to the patient and his family. I recommended for him to continue treatment with maintenance Alimta at the same dose. For the mild nausea after the chemotherapy, the patient was advised to take Compazine 30 minutes before his meal in the few days after his chemotherapy. For IV access, I will refer the patient to interventional radiology for consideration of Port-A-Cath placement. The patient would come back for followup visit in 3 weeks with the next cycle of his chemotherapy.  All questions were answered. The patient knows to call the clinic with any problems, questions or concerns. We can certainly see the patient much sooner if necessary.  I spent 15 minutes counseling the patient face to face. The total time spent in the appointment was 25 minutes.

## 2013-02-20 ENCOUNTER — Telehealth: Payer: Self-pay | Admitting: *Deleted

## 2013-02-20 NOTE — Telephone Encounter (Signed)
Per staff message and POF I have scheduled appts.  JMW  

## 2013-02-21 ENCOUNTER — Other Ambulatory Visit: Payer: Self-pay | Admitting: Radiology

## 2013-02-23 ENCOUNTER — Ambulatory Visit (HOSPITAL_COMMUNITY)
Admission: RE | Admit: 2013-02-23 | Discharge: 2013-02-23 | Disposition: A | Discharge status: Home / Self Care | Payer: BC Managed Care – PPO | Source: Ambulatory Visit | Attending: Internal Medicine | Admitting: Internal Medicine

## 2013-02-23 ENCOUNTER — Other Ambulatory Visit: Payer: Self-pay | Admitting: Internal Medicine

## 2013-02-23 ENCOUNTER — Encounter (HOSPITAL_COMMUNITY): Payer: Self-pay

## 2013-02-23 DIAGNOSIS — Z79899 Other long term (current) drug therapy: Secondary | ICD-10-CM | POA: Insufficient documentation

## 2013-02-23 DIAGNOSIS — C801 Malignant (primary) neoplasm, unspecified: Secondary | ICD-10-CM | POA: Insufficient documentation

## 2013-02-23 DIAGNOSIS — Z87891 Personal history of nicotine dependence: Secondary | ICD-10-CM | POA: Insufficient documentation

## 2013-02-23 DIAGNOSIS — C3490 Malignant neoplasm of unspecified part of unspecified bronchus or lung: Secondary | ICD-10-CM | POA: Insufficient documentation

## 2013-02-23 LAB — CBC WITH DIFFERENTIAL/PLATELET
Basophils Absolute: 0 10*3/uL (ref 0.0–0.1)
HCT: 38.4 % — ABNORMAL LOW (ref 39.0–52.0)
Lymphocytes Relative: 8 % — ABNORMAL LOW (ref 12–46)
Monocytes Absolute: 0.1 10*3/uL (ref 0.1–1.0)
Neutro Abs: 9.2 10*3/uL — ABNORMAL HIGH (ref 1.7–7.7)
Neutrophils Relative %: 92 % — ABNORMAL HIGH (ref 43–77)
Platelets: 273 10*3/uL (ref 150–400)
RDW: 13.9 % (ref 11.5–15.5)
WBC: 10 10*3/uL (ref 4.0–10.5)

## 2013-02-23 LAB — PROTIME-INR
INR: 0.9 (ref 0.00–1.49)
Prothrombin Time: 12.1 seconds (ref 11.6–15.2)

## 2013-02-23 MED ORDER — CEFAZOLIN SODIUM-DEXTROSE 2-3 GM-% IV SOLR
2.0000 g | Freq: Once | INTRAVENOUS | Status: AC
  Administered 2013-02-23: 2 g via INTRAVENOUS
  Filled 2013-02-23: qty 50

## 2013-02-23 MED ORDER — FENTANYL CITRATE 0.05 MG/ML IJ SOLN
INTRAMUSCULAR | Status: AC | PRN
  Administered 2013-02-23: 100 ug via INTRAVENOUS

## 2013-02-23 MED ORDER — LIDOCAINE HCL 1 % IJ SOLN
INTRAMUSCULAR | Status: AC
  Filled 2013-02-23: qty 20

## 2013-02-23 MED ORDER — HEPARIN SOD (PORK) LOCK FLUSH 100 UNIT/ML IV SOLN
500.0000 [IU] | Freq: Once | INTRAVENOUS | Status: AC
  Administered 2013-02-23: 500 [IU] via INTRAVENOUS

## 2013-02-23 MED ORDER — MIDAZOLAM HCL 2 MG/2ML IJ SOLN
INTRAMUSCULAR | Status: AC | PRN
  Administered 2013-02-23: 1 mg via INTRAVENOUS

## 2013-02-23 MED ORDER — SODIUM CHLORIDE 0.9 % IV SOLN
INTRAVENOUS | Status: DC
  Administered 2013-02-23: 20 mL/h via INTRAVENOUS

## 2013-02-23 MED ORDER — MIDAZOLAM HCL 2 MG/2ML IJ SOLN
INTRAMUSCULAR | Status: AC
  Filled 2013-02-23: qty 4

## 2013-02-23 MED ORDER — FENTANYL CITRATE 0.05 MG/ML IJ SOLN
INTRAMUSCULAR | Status: AC
Start: 2013-02-23 — End: 2013-02-24
  Filled 2013-02-23: qty 4

## 2013-02-23 NOTE — Procedures (Signed)
Successful placement of right jugular port.  Tip at SVC/RA junction and ready to use.  No immediate complication.

## 2013-02-23 NOTE — H&P (Signed)
Thomas May is an 67 y.o. male.   Chief Complaint: "I'm here for a port a cath" HPI: Patient with history of metastatic NSC lung carcinoma presents today for port a cath placement for chemotherapy.  Past Medical History  Diagnosis Date  . Tobacco use   . Lung mass   . BPH (benign prostatic hyperplasia)     Past Surgical History  Procedure Laterality Date  . Video bronchoscopy  05/31/2012    Procedure: VIDEO BRONCHOSCOPY WITH FLUORO;  Surgeon: Nyoka Cowden, MD;  Location: [facility];  Service: Endoscopy;  Laterality: Bilateral;    Family History  Problem Relation Age of Onset  . Ulcers Other   . Coronary artery disease Neg Hx    Social History:  reports that he quit smoking about 9 months ago. His smoking use included Cigarettes. He has a 20 pack-year smoking history. He has never used smokeless tobacco. He reports that he drinks about 1.5 ounces of alcohol per week. He reports that he does not use illicit drugs.  Allergies: No Known Allergies  Current outpatient prescriptions:docusate sodium (COLACE) 100 MG capsule, Take 100 mg by mouth 2 (two) times daily., Disp: , Rfl: ;  folic acid (FOLVITE) 1 MG tablet, Take 1 tablet (1 mg total) by mouth daily., Disp: 30 tablet, Rfl: 2;  Multiple Vitamin (MULTIVITAMIN) tablet, Take 1 tablet by mouth daily., Disp: , Rfl: ;  Saw Palmetto, Serenoa repens, (SAW PALMETTO PO), Take 1 capsule by mouth daily., Disp: , Rfl:  terazosin (HYTRIN) 1 MG capsule, Take 1 capsule (1 mg total) by mouth at bedtime., Disp: 30 capsule, Rfl: 3;  dexamethasone (DECADRON) 4 MG tablet, 4 mg by mouth twice a day day before, day of and day after the chemotherapy every 3 weeks, Disp: 40 tablet, Rfl: 0;  megestrol (MEGACE ORAL) 40 MG/ML suspension, Take 20 mLs (800 mg total) by mouth daily., Disp: 480 mL, Rfl: 0 prochlorperazine (COMPAZINE) 10 MG tablet, Take 5 mg by mouth every 6 (six) hours as needed. Take 0.5 tablets (5 mg total) by mouth every 6 (six) hours as needed for  Nausea., Disp: , Rfl:  Current facility-administered medications:0.9 %  sodium chloride infusion, , Intravenous, Continuous, Brayton El, PA-C, Last Rate: 20 mL/hr at 02/23/13 1213, 20 mL/hr at 02/23/13 1213;  ceFAZolin (ANCEF) IVPB 2 g/50 mL premix, 2 g, Intravenous, Once, Brayton El, PA-C   Results for orders placed during the hospital encounter of 02/23/13 (from the past 48 hour(s))  CBC WITH DIFFERENTIAL     Status: Abnormal   Collection Time    02/23/13 12:10 PM      Result Value Range   WBC 10.0  4.0 - 10.5 K/uL   RBC 4.19 (*) 4.22 - 5.81 MIL/uL   Hemoglobin 13.7  13.0 - 17.0 g/dL   HCT 96.0 (*) 45.4 - 09.8 %   MCV 91.6  78.0 - 100.0 fL   MCH 32.7  26.0 - 34.0 pg   MCHC 35.7  30.0 - 36.0 g/dL   RDW 11.9  14.7 - 82.9 %   Platelets 273  150 - 400 K/uL   Neutrophils Relative % 92 (*) 43 - 77 %   Neutro Abs 9.2 (*) 1.7 - 7.7 K/uL   Lymphocytes Relative 8 (*) 12 - 46 %   Lymphs Abs 0.8  0.7 - 4.0 K/uL   Monocytes Relative 1 (*) 3 - 12 %   Monocytes Absolute 0.1  0.1 - 1.0 K/uL   Eosinophils Relative 0  0 - 5 %   Eosinophils Absolute 0.0  0.0 - 0.7 K/uL   Basophils Relative 0  0 - 1 %   Basophils Absolute 0.0  0.0 - 0.1 K/uL  PROTIME-INR     Status: None   Collection Time    02/23/13 12:10 PM      Result Value Range   Prothrombin Time 12.1  11.6 - 15.2 seconds   INR 0.90  0.00 - 1.49   No results found.  Review of Systems  Constitutional: Negative for fever and chills.  Respiratory: Negative for cough and shortness of breath.   Cardiovascular: Negative for chest pain.  Gastrointestinal: Negative for vomiting.       Occ nausea, abd "bloating"  Musculoskeletal: Negative for back pain.  Neurological: Negative for headaches.  Endo/Heme/Allergies: Does not bruise/bleed easily.    Vitals:  BP 151/93  HR 69  R 18  TEMP 98.3  O2 SATS 100% RA                                                 Physical Exam  Constitutional: He is oriented to person, place, and time. He  appears well-developed and well-nourished.  Cardiovascular: Normal rate and regular rhythm.   Respiratory: Effort normal.  Dim BS on rt with few basilar crackles, left clear  Musculoskeletal: Normal range of motion. He exhibits no edema.  Neurological: He is alert and oriented to person, place, and time.     Assessment/Plan Pt with metastatic NSC lung carcinoma. Plan is for placement of a port a cath today  for chemotherapy. Details/risks of procedure d/w pt/daughter via interpreter with their understanding and consent.  ALLRED,D KEVIN 02/23/2013, 12:40 PM

## 2013-03-12 ENCOUNTER — Other Ambulatory Visit: Payer: Self-pay | Admitting: *Deleted

## 2013-03-12 DIAGNOSIS — C3490 Malignant neoplasm of unspecified part of unspecified bronchus or lung: Secondary | ICD-10-CM

## 2013-03-12 MED ORDER — LIDOCAINE-PRILOCAINE 2.5-2.5 % EX CREA: TOPICAL_CREAM | CUTANEOUS | Status: DC | PRN

## 2013-03-12 NOTE — Telephone Encounter (Signed)
Pharmacist called asking for an order for emla cream.  This patient is to begin chemotherapy treatments tomorrow.

## 2013-03-13 ENCOUNTER — Ambulatory Visit (HOSPITAL_BASED_OUTPATIENT_CLINIC_OR_DEPARTMENT_OTHER): Payer: BC Managed Care – PPO

## 2013-03-13 ENCOUNTER — Encounter: Payer: Self-pay | Admitting: Internal Medicine

## 2013-03-13 ENCOUNTER — Telehealth: Payer: Self-pay | Admitting: Internal Medicine

## 2013-03-13 ENCOUNTER — Ambulatory Visit (HOSPITAL_BASED_OUTPATIENT_CLINIC_OR_DEPARTMENT_OTHER): Payer: BC Managed Care – PPO | Admitting: Internal Medicine

## 2013-03-13 ENCOUNTER — Telehealth: Payer: Self-pay | Admitting: *Deleted

## 2013-03-13 ENCOUNTER — Other Ambulatory Visit (HOSPITAL_BASED_OUTPATIENT_CLINIC_OR_DEPARTMENT_OTHER): Payer: BC Managed Care – PPO | Admitting: Lab

## 2013-03-13 VITALS — BP 133/96 | HR 105 | Temp 98.7°F | Resp 17 | Ht 65.0 in | Wt 144.7 lb

## 2013-03-13 DIAGNOSIS — C3490 Malignant neoplasm of unspecified part of unspecified bronchus or lung: Secondary | ICD-10-CM

## 2013-03-13 DIAGNOSIS — C3430 Malignant neoplasm of lower lobe, unspecified bronchus or lung: Secondary | ICD-10-CM

## 2013-03-13 DIAGNOSIS — Z5111 Encounter for antineoplastic chemotherapy: Secondary | ICD-10-CM

## 2013-03-13 DIAGNOSIS — F17200 Nicotine dependence, unspecified, uncomplicated: Secondary | ICD-10-CM

## 2013-03-13 LAB — COMPREHENSIVE METABOLIC PANEL (CC13)
BUN: 14.4 mg/dL (ref 7.0–26.0)
CO2: 24 mEq/L (ref 22–29)
Calcium: 9.6 mg/dL (ref 8.4–10.4)
Chloride: 107 mEq/L (ref 98–107)
Creatinine: 1 mg/dL (ref 0.7–1.3)
Glucose: 159 mg/dl — ABNORMAL HIGH (ref 70–99)
Total Bilirubin: 0.69 mg/dL (ref 0.20–1.20)

## 2013-03-13 LAB — CBC WITH DIFFERENTIAL/PLATELET
Eosinophils Absolute: 0 10*3/uL (ref 0.0–0.5)
HCT: 36.7 % — ABNORMAL LOW (ref 38.4–49.9)
LYMPH%: 9.4 % — ABNORMAL LOW (ref 14.0–49.0)
MCV: 92.9 fL (ref 79.3–98.0)
MONO#: 0.1 10*3/uL (ref 0.1–0.9)
MONO%: 0.6 % (ref 0.0–14.0)
NEUT#: 7.1 10*3/uL — ABNORMAL HIGH (ref 1.5–6.5)
NEUT%: 89.8 % — ABNORMAL HIGH (ref 39.0–75.0)
Platelets: 239 10*3/uL (ref 140–400)
WBC: 8 10*3/uL (ref 4.0–10.3)

## 2013-03-13 MED ORDER — CYANOCOBALAMIN 1000 MCG/ML IJ SOLN
1000.0000 ug | Freq: Once | INTRAMUSCULAR | Status: AC
  Administered 2013-03-13: 1000 ug via INTRAMUSCULAR

## 2013-03-13 MED ORDER — SODIUM CHLORIDE 0.9 % IV SOLN
Freq: Once | INTRAVENOUS | Status: AC
  Administered 2013-03-13: 15:00:00 via INTRAVENOUS

## 2013-03-13 MED ORDER — HEPARIN SOD (PORK) LOCK FLUSH 100 UNIT/ML IV SOLN
500.0000 [IU] | Freq: Once | INTRAVENOUS | Status: AC | PRN
  Administered 2013-03-13: 500 [IU]
  Filled 2013-03-13: qty 5

## 2013-03-13 MED ORDER — ONDANSETRON 8 MG/50ML IVPB (CHCC)
8.0000 mg | Freq: Once | INTRAVENOUS | Status: AC
  Administered 2013-03-13: 8 mg via INTRAVENOUS

## 2013-03-13 MED ORDER — SODIUM CHLORIDE 0.9 % IJ SOLN
10.0000 mL | INTRAMUSCULAR | Status: DC | PRN
  Administered 2013-03-13: 10 mL
  Filled 2013-03-13: qty 10

## 2013-03-13 MED ORDER — SODIUM CHLORIDE 0.9 % IV SOLN
500.0000 mg/m2 | Freq: Once | INTRAVENOUS | Status: AC
  Administered 2013-03-13: 875 mg via INTRAVENOUS
  Filled 2013-03-13: qty 35

## 2013-03-13 MED ORDER — DEXAMETHASONE SODIUM PHOSPHATE 10 MG/ML IJ SOLN
10.0000 mg | Freq: Once | INTRAMUSCULAR | Status: AC
  Administered 2013-03-13: 10 mg via INTRAVENOUS

## 2013-03-13 NOTE — Patient Instructions (Addendum)
[facility] Discharge Instructions for Patients Receiving Chemotherapy  Today you received the following chemotherapy agents Alimta and Vitamin B12 injection.  To help prevent nausea and vomiting after your treatment, we encourage you to take your nausea medication as directed.   If you develop nausea and vomiting that is not controlled by your nausea medication, call the clinic.   BELOW ARE SYMPTOMS THAT SHOULD BE REPORTED IMMEDIATELY:  *FEVER GREATER THAN 100.5 F  *CHILLS WITH OR WITHOUT FEVER  NAUSEA AND VOMITING THAT IS NOT CONTROLLED WITH YOUR NAUSEA MEDICATION  *UNUSUAL SHORTNESS OF BREATH  *UNUSUAL BRUISING OR BLEEDING  TENDERNESS IN MOUTH AND THROAT WITH OR WITHOUT PRESENCE OF ULCERS  *URINARY PROBLEMS  *BOWEL PROBLEMS  UNUSUAL RASH Items with * indicate a potential emergency and should be followed up as soon as possible.  Feel free to call the clinic you have any questions or concerns. The clinic phone number is [phone].

## 2013-03-13 NOTE — Patient Instructions (Signed)
Continue maintenance chemotherapy with Alimta. Followup visit in 3 weeks

## 2013-03-13 NOTE — Telephone Encounter (Signed)
Per staff phone call and POF I have schedueld appts.  JMW  

## 2013-03-13 NOTE — Progress Notes (Signed)
[facility] [phone]   [phone]  OFFICE PROGRESS NOTE  [name], MD [address] [place] [address]  DIAGNOSIS: Metastatic non-small cell lung cancer favoring adenocarcinoma with negative EGFR mutation, negative ALK gene translocation and negative ROS1 diagnosed in August of 2013   PRIOR THERAPY: Status post 4 cycles of systemic chemotherapy with carboplatin and Alimta with partial response at [facility]. Last dose was given in November of 2013.   CURRENT THERAPY: Maintenance chemotherapy with Alimta 500 mg/M2 started on 09/26/2012, status post 4 cycles.   INTERVAL HISTORY: Thomas May 67 y.o. male returns to the clinic today for followup visit accompanied by his son and his [place] interpreter. The patient tolerated the last cycle of his systemic chemotherapy fairly well with no significant adverse effects. He denied having any significant fever or chills. He has no nausea or vomiting. He has no chest pain, shortness breath, cough or hemoptysis. He has a Port-A-Cath placed recently by interventional radiology.  MEDICAL HISTORY: Past Medical History  Diagnosis Date  . Tobacco use   . Lung mass   . BPH (benign prostatic hyperplasia)     ALLERGIES:  has No Known Allergies.  MEDICATIONS:  Current Outpatient Prescriptions  Medication Sig Dispense Refill  . dexamethasone (DECADRON) 4 MG tablet 4 mg by mouth twice a day day before, day of and day after the chemotherapy every 3 weeks  40 tablet  0  . docusate sodium (COLACE) 100 MG capsule Take 100 mg by mouth 2 (two) times daily.      . folic acid (FOLVITE) 1 MG tablet Take 1 tablet (1 mg total) by mouth daily.  30 tablet  2  . lidocaine-prilocaine (EMLA) cream Apply topically as needed. Cover with plastic wrap 1.5 hours before chemo.  30 g  1  . megestrol (MEGACE ORAL) 40 MG/ML suspension Take 20 mLs (800 mg total) by mouth daily.  480 mL  0  . Multiple  Vitamin (MULTIVITAMIN) tablet Take 1 tablet by mouth daily.      . prochlorperazine (COMPAZINE) 10 MG tablet Take 5 mg by mouth every 6 (six) hours as needed. Take 0.5 tablets (5 mg total) by mouth every 6 (six) hours as needed for Nausea.      . Saw Palmetto, Serenoa repens, (SAW PALMETTO PO) Take 1 capsule by mouth daily.      Marland Kitchen terazosin (HYTRIN) 1 MG capsule Take 1 capsule (1 mg total) by mouth at bedtime.  30 capsule  3   No current facility-administered medications for this visit.    SURGICAL HISTORY:  Past Surgical History  Procedure Laterality Date  . Video bronchoscopy  05/31/2012    Procedure: VIDEO BRONCHOSCOPY WITH FLUORO;  Surgeon: Nyoka Cowden, MD;  Location: [facility];  Service: Endoscopy;  Laterality: Bilateral;    REVIEW OF SYSTEMS:  A comprehensive review of systems was negative.   PHYSICAL EXAMINATION: General appearance: alert, cooperative and no distress Head: Normocephalic, without obvious abnormality, atraumatic Neck: no adenopathy Lymph nodes: Cervical, supraclavicular, and axillary nodes normal. Resp: clear to auscultation bilaterally Cardio: regular rate and rhythm, S1, S2 normal, no murmur, click, rub or gallop GI: soft, non-tender; bowel sounds normal; no masses,  no organomegaly Extremities: extremities normal, atraumatic, no cyanosis or edema  ECOG PERFORMANCE STATUS: 0 - Asymptomatic  Blood pressure 133/96, pulse 105, temperature 98.7 F (37.1 C), temperature source Oral, resp. rate 17, height 5\' 5"  (1.651 m),  weight 144 lb 11.2 oz (65.635 kg).  LABORATORY DATA: Lab Results  Component Value Date   WBC 8.0 03/13/2013   HGB 12.8* 03/13/2013   HCT 36.7* 03/13/2013   MCV 92.9 03/13/2013   PLT 239 03/13/2013      Chemistry      Component Value Date/Time   NA 141 02/19/2013 1314   NA 141 06/02/2012 1611   K 3.5 02/19/2013 1314   K 3.9 06/02/2012 1611   CL 109* 02/19/2013 1314   CL 108 06/02/2012 1611   CO2 23 02/19/2013 1314   CO2 24 06/02/2012 1611    BUN 18.3 02/19/2013 1314   BUN 13 06/02/2012 1611   CREATININE 1.0 02/19/2013 1314   CREATININE 0.65 06/02/2012 1611      Component Value Date/Time   CALCIUM 9.6 02/19/2013 1314   CALCIUM 9.6 06/02/2012 1611   ALKPHOS 88 02/19/2013 1314   AST 43* 02/19/2013 1314   ALT 62* 02/19/2013 1314   BILITOT 0.63 02/19/2013 1314       RADIOGRAPHIC STUDIES: Ct Chest W Contrast  02/16/2013   *RADIOLOGY REPORT*  Clinical Data: Restaging metastatic non-small cell lung cancer. Chemotherapy in progress.  CT CHEST WITH CONTRAST  Technique:  Multidetector CT imaging of the chest was performed following the standard protocol during bolus administration of intravenous contrast.  Contrast: 80mL OMNIPAQUE IOHEXOL 300 MG/ML  SOLN  Comparison: Chest CT 12/18/2012.  Findings:  Mediastinum: Heart size is borderline enlarged. There is no significant pericardial fluid, thickening or pericardial calcification. There is atherosclerosis of the thoracic aorta, the great vessels of the mediastinum and the coronary arteries, including calcified atherosclerotic plaque in the left anterior descending, circumflex and right coronary arteries. No pathologically enlarged mediastinal or hilar lymph nodes. Esophagus is unremarkable in appearance.  Images 27 of series 2 and 5 demonstrate a small area of nodular soft tissue prominence in the proximal bronchus intermedius which is new compared to the prior examination.  Lungs/Pleura: Previously noted right middle lobe nodule has decreased slightly in size, currently measuring 1.6 x 2.3 cm.  This continues have microlobulated slightly spiculated margins with retraction of the overlying pleura.  Surrounding architectural distortion and a tiny 3 mm satellite nodule (image 34 of series five) are unchanged.  There are a few other ill-defined pulmonary nodules and areas of ground-glass attenuation with a architectural distortions scattered throughout the lungs bilaterally, however, these are favored to be  related to post infectious or inflammatory scarring as they are significantly decreased in size compared to the original prior examination 05/17/2012.  Alternatively, these could represent treated metastases.  No acute consolidative airspace disease.  No pleural effusions.  Upper Abdomen: 1.1 cm low attenuation lesion in the right lobe of the liver is unchanged and although too small to definitively characterize, is favored to represent a small cyst.  Musculoskeletal: There are no aggressive appearing lytic or blastic lesions noted in the visualized portions of the skeleton.  Old healed fracture of the posterolateral aspect of the right sixth rib is incidentally noted.  IMPRESSION: 1.  Slight interval decrease in size of the primary right middle lobe pulmonary nodule compared to the prior examination suggesting a positive response to therapy. 2.  Other scattered small ill-defined nodules and areas of ground- glass attenuation and architectural distortion, less apparent than prior examinations, likely to represent evolving areas of post infectious or inflammatory scarring, or less likely may represent treated metastatic disease. 3.  However, today's study demonstrates a subtle new area of nodular  soft tissue prominence along the lateral aspect of the proximal bronchus intermedius.  Whether not this represents retained mucus or other debris within the bronchus, or malignant soft tissue is uncertain.  At the very least, close attention on future follow up studies is recommended.  Alternatively, this could be further evaluated with bronchoscopy if clinically indicated. 4. Atherosclerosis, including two-vessel coronary artery disease. Assessment for potential risk factor modification, dietary therapy or pharmacologic therapy may be warranted, if clinically indicated.   Original Report Authenticated By: Trudie Reed, M.D.   Ir Fluoro Guide Cv Line Right  02/23/2013   *RADIOLOGY REPORT*  Clinical Data: Metastatic  lung cancer.  FLUOROSCOPIC AND ULTRASOUND GUIDED PLACEMENT OF A SUBCUTANEOUS PORT.  Physician: Rachelle Hora. Henn, MD  Medications:Versed 1 mg, Fentanyl 100 mcg. A radiology nurse monitored the patient for moderate sedation.  Ancef 2 gm.  As antibiotic prophylaxis, Ancef  was ordered pre-procedure and administered intravenously within one hour of incision.  Moderate sedation time:30 minutes  Fluoroscopy time: 18 seconds  Procedure:  The risks of the procedure were explained to the patient.  Informed consent was obtained.  Patient was placed supine on the interventional table.  Ultrasound confirmed a patent right internal jugular vein.  The right chest and neck were cleaned with a skin antiseptic and a sterile drape was placed.  Maximal barrier sterile technique was utilized including caps, mask, sterile gowns, sterile gloves, sterile drape, hand hygiene and skin antiseptic. The right neck was anesthetized with 1% lidocaine.  Small incision was made in the right neck with a blade.  Micropuncture set was placed in the right IJ with ultrasound guidance.  The micropuncture wire was used for measurement purposes.  The right chest was anesthetized with 1% lidocaine with epinephrine.  #15 blade was used to make an incision and a subcutaneous port pocket was formed. 8 french Power Port was assembled.  Subcutaneous tunnel was formed with a stiff tunneling device.  The port catheter was brought through the subcutaneous tunnel.  The port was placed in the subcutaneous pocket.  The micropuncture set was exchanged for a peel-away sheath.  The catheter was placed through the peel-away sheath and the tip was positioned at the junction of the superior vena cava and right atrium.  Catheter placement was confirmed with fluoroscopy.  The port was accessed and flushed with heparinized saline.  The port pocket was closed using two layers of absorbable sutures and Dermabond.  The vein skin site was closed using a single layer of absorbable  suture and Dermabond.  Sterile dressings were applied.  Patient tolerated the procedure well without an immediate complication.  Ultrasound and fluoroscopic images were taken and saved for this procedure.  Complications: None  Impression:  Placement of a subcutaneous port device.  The catheter tip is at the cavoatrial junction and ready to be used.   Original Report Authenticated By: Richarda Overlie, M.D.    ASSESSMENT: This is a very pleasant 67 years old Asian male with history of metastatic non-small cell lung cancer with negative EGFR mutation, negative ALK gene translocation and negative ROS 1. He is currently on maintenance treatment with Alimta status post 4 cycles.  PLAN: The patient is tolerating his treatment fairly well with no significant adverse effects. I recommended for him to continue chemotherapy with the same regimen for now. He would come back for followup visit in 3 weeks with the start of the next cycle of his chemotherapy. He was advised to call immediately if he has  any concerning symptoms in the interval. All questions were answered. The patient knows to call the clinic with any problems, questions or concerns. We can certainly see the patient much sooner if necessary.

## 2013-04-03 ENCOUNTER — Encounter: Payer: Self-pay | Admitting: Internal Medicine

## 2013-04-03 ENCOUNTER — Other Ambulatory Visit (HOSPITAL_BASED_OUTPATIENT_CLINIC_OR_DEPARTMENT_OTHER): Payer: BC Managed Care – PPO | Admitting: Lab

## 2013-04-03 ENCOUNTER — Telehealth: Payer: Self-pay | Admitting: Internal Medicine

## 2013-04-03 ENCOUNTER — Ambulatory Visit (HOSPITAL_BASED_OUTPATIENT_CLINIC_OR_DEPARTMENT_OTHER): Payer: BC Managed Care – PPO

## 2013-04-03 ENCOUNTER — Ambulatory Visit (HOSPITAL_BASED_OUTPATIENT_CLINIC_OR_DEPARTMENT_OTHER): Payer: BC Managed Care – PPO | Admitting: Internal Medicine

## 2013-04-03 ENCOUNTER — Telehealth: Payer: Self-pay | Admitting: *Deleted

## 2013-04-03 VITALS — BP 151/86 | HR 69 | Temp 97.2°F | Resp 18 | Ht 65.0 in | Wt 145.7 lb

## 2013-04-03 DIAGNOSIS — C3430 Malignant neoplasm of lower lobe, unspecified bronchus or lung: Secondary | ICD-10-CM

## 2013-04-03 DIAGNOSIS — C3491 Malignant neoplasm of unspecified part of right bronchus or lung: Secondary | ICD-10-CM

## 2013-04-03 DIAGNOSIS — Z5111 Encounter for antineoplastic chemotherapy: Secondary | ICD-10-CM

## 2013-04-03 LAB — CBC WITH DIFFERENTIAL/PLATELET
BASO%: 0.3 % (ref 0.0–2.0)
Eosinophils Absolute: 0 10*3/uL (ref 0.0–0.5)
HCT: 36.3 % — ABNORMAL LOW (ref 38.4–49.9)
HGB: 12.5 g/dL — ABNORMAL LOW (ref 13.0–17.1)
MCHC: 34.4 g/dL (ref 32.0–36.0)
MONO#: 0.1 10*3/uL (ref 0.1–0.9)
NEUT#: 4.8 10*3/uL (ref 1.5–6.5)
NEUT%: 83 % — ABNORMAL HIGH (ref 39.0–75.0)
WBC: 5.8 10*3/uL (ref 4.0–10.3)
lymph#: 0.9 10*3/uL (ref 0.9–3.3)

## 2013-04-03 LAB — COMPREHENSIVE METABOLIC PANEL (CC13)
ALT: 51 U/L (ref 0–55)
CO2: 23 mEq/L (ref 22–29)
Calcium: 9.9 mg/dL (ref 8.4–10.4)
Chloride: 109 mEq/L — ABNORMAL HIGH (ref 98–107)
Creatinine: 1 mg/dL (ref 0.7–1.3)
Glucose: 168 mg/dl — ABNORMAL HIGH (ref 70–99)
Total Protein: 7.8 g/dL (ref 6.4–8.3)

## 2013-04-03 MED ORDER — ONDANSETRON 8 MG/50ML IVPB (CHCC)
8.0000 mg | Freq: Once | INTRAVENOUS | Status: AC
  Administered 2013-04-03: 8 mg via INTRAVENOUS

## 2013-04-03 MED ORDER — DEXAMETHASONE SODIUM PHOSPHATE 10 MG/ML IJ SOLN
10.0000 mg | Freq: Once | INTRAMUSCULAR | Status: AC
  Administered 2013-04-03: 10 mg via INTRAVENOUS

## 2013-04-03 MED ORDER — SODIUM CHLORIDE 0.9 % IV SOLN
500.0000 mg/m2 | Freq: Once | INTRAVENOUS | Status: AC
  Administered 2013-04-03: 875 mg via INTRAVENOUS
  Filled 2013-04-03: qty 35

## 2013-04-03 MED ORDER — SODIUM CHLORIDE 0.9 % IJ SOLN
10.0000 mL | INTRAMUSCULAR | Status: DC | PRN
  Administered 2013-04-03: 10 mL
  Filled 2013-04-03: qty 10

## 2013-04-03 MED ORDER — SODIUM CHLORIDE 0.9 % IV SOLN: Freq: Once | INTRAVENOUS | Status: DC

## 2013-04-03 MED ORDER — HEPARIN SOD (PORK) LOCK FLUSH 100 UNIT/ML IV SOLN
500.0000 [IU] | Freq: Once | INTRAVENOUS | Status: AC | PRN
  Administered 2013-04-03: 500 [IU]
  Filled 2013-04-03: qty 5

## 2013-04-03 NOTE — Progress Notes (Signed)
[facility] Telephone:(336) [phone]   Fax:(336) [phone]  OFFICE PROGRESS NOTE  [name], MD [address] [place] [address]  DIAGNOSIS: Metastatic non-small cell lung cancer favoring adenocarcinoma with negative EGFR mutation, negative ALK gene translocation and negative ROS1 diagnosed in August of 2013   PRIOR THERAPY: Status post 4 cycles of systemic chemotherapy with carboplatin and Alimta with partial response at [facility]. Last dose was given in November of 2013.   CURRENT THERAPY: Maintenance chemotherapy with Alimta 500 mg/M2 started on 09/26/2012, status post 5 cycles.   INTERVAL HISTORY: Thomas May 67 y.o. male returns to the clinic today for followup visit accompanied his daughter and his interpreter. The patient is feeling fine today with no specific complaints except for mild fatigue. He had an episode of nausea vomiting as well as abdominal pain 2 weeks after his last cycle of the chemotherapy and he was worried that it could be related to his treatment. He denied having any current fever or chills. He denied having any chest pain or shortness of breath, no cough or hemoptysis. He has no current nausea or vomiting.   MEDICAL HISTORY: Past Medical History  Diagnosis Date  . Tobacco use   . Lung mass   . BPH (benign prostatic hyperplasia)     ALLERGIES:  has No Known Allergies.  MEDICATIONS:  Current Outpatient Prescriptions  Medication Sig Dispense Refill  . dexamethasone (DECADRON) 4 MG tablet 4 mg by mouth twice a day day before, day of and day after the chemotherapy every 3 weeks  40 tablet  0  . docusate sodium (COLACE) 100 MG capsule Take 100 mg by mouth 2 (two) times daily.      . folic acid (FOLVITE) 1 MG tablet Take 1 tablet (1 mg total) by mouth daily.  30 tablet  2  . lidocaine-prilocaine (EMLA) cream Apply topically as needed. Cover with plastic wrap 1.5 hours before chemo.  30 g  1  . megestrol  (MEGACE ORAL) 40 MG/ML suspension Take 20 mLs (800 mg total) by mouth daily.  480 mL  0  . Multiple Vitamin (MULTIVITAMIN) tablet Take 1 tablet by mouth daily.      . prochlorperazine (COMPAZINE) 10 MG tablet Take 5 mg by mouth every 6 (six) hours as needed. Take 0.5 tablets (5 mg total) by mouth every 6 (six) hours as needed for Nausea.      . Saw Palmetto, Serenoa repens, (SAW PALMETTO PO) Take 1 capsule by mouth daily.      Marland Kitchen terazosin (HYTRIN) 1 MG capsule Take 1 capsule (1 mg total) by mouth at bedtime.  30 capsule  3   No current facility-administered medications for this visit.    SURGICAL HISTORY:  Past Surgical History  Procedure Laterality Date  . Video bronchoscopy  05/31/2012    Procedure: VIDEO BRONCHOSCOPY WITH FLUORO;  Surgeon: Nyoka Cowden, MD;  Location: [facility];  Service: Endoscopy;  Laterality: Bilateral;    REVIEW OF SYSTEMS:  A comprehensive review of systems was negative.   PHYSICAL EXAMINATION: General appearance: alert, cooperative and no distress Head: Normocephalic, without obvious abnormality, atraumatic Neck: no adenopathy Lymph nodes: Cervical, supraclavicular, and axillary nodes normal. Resp: clear to auscultation bilaterally Cardio: regular rate and rhythm, S1, S2 normal, no murmur, click, rub or gallop GI: soft, non-tender; bowel sounds normal; no masses,  no organomegaly Extremities: extremities normal, atraumatic, no cyanosis or edema  ECOG PERFORMANCE STATUS:  1 - Symptomatic but completely ambulatory  Blood pressure 151/86, pulse 69, temperature 97.2 F (36.2 C), temperature source Oral, resp. rate 18, height 5\' 5"  (1.651 m), weight 145 lb 11.2 oz (66.089 kg).  LABORATORY DATA: Lab Results  Component Value Date   WBC 5.8 04/03/2013   HGB 12.5* 04/03/2013   HCT 36.3* 04/03/2013   MCV 94.5 04/03/2013   PLT 273 04/03/2013      Chemistry      Component Value Date/Time   NA 140 03/13/2013 1310   NA 141 06/02/2012 1611   K 3.8 03/13/2013 1310    K 3.9 06/02/2012 1611   CL 107 03/13/2013 1310   CL 108 06/02/2012 1611   CO2 24 03/13/2013 1310   CO2 24 06/02/2012 1611   BUN 14.4 03/13/2013 1310   BUN 13 06/02/2012 1611   CREATININE 1.0 03/13/2013 1310   CREATININE 0.65 06/02/2012 1611      Component Value Date/Time   CALCIUM 9.6 03/13/2013 1310   CALCIUM 9.6 06/02/2012 1611   ALKPHOS 91 03/13/2013 1310   AST 28 03/13/2013 1310   ALT 54 03/13/2013 1310   BILITOT 0.69 03/13/2013 1310       RADIOGRAPHIC STUDIES: No results found.  ASSESSMENT AND PLAN: This is a very pleasant 67 years old Asian male with metastatic non-small cell lung cancer currently on maintenance Alimta status post 5 cycles. The patient is tolerating his treatment fairly well. I recommended for him to proceed with cycle #6 today as scheduled. He would come back for followup visit in 3 weeks with the next cycle of chemotherapy after repeating CT scan of the chest, abdomen and pelvis for restaging of his disease. He was advised to call immediately if he has any concerning symptoms in the interval All questions were answered. The patient knows to call the clinic with any problems, questions or concerns. We can certainly see the patient much sooner if necessary.

## 2013-04-03 NOTE — Telephone Encounter (Signed)
Gave pt appt for lab,MD and chermo for July and August 2014

## 2013-04-03 NOTE — Patient Instructions (Signed)
Continue maintenance chemotherapy today as scheduled. Followup visit in 3 weeks after repeating CT scan of the chest, abdomen and pelvis.

## 2013-04-03 NOTE — Telephone Encounter (Signed)
Per staff phone call and POF I have schedueld appts.  JMW  

## 2013-04-23 ENCOUNTER — Ambulatory Visit (HOSPITAL_COMMUNITY)
Admission: RE | Admit: 2013-04-23 | Discharge: 2013-04-23 | Disposition: A | Discharge status: Home / Self Care | Payer: BC Managed Care – PPO | Source: Ambulatory Visit | Attending: Internal Medicine | Admitting: Internal Medicine

## 2013-04-23 DIAGNOSIS — C3491 Malignant neoplasm of unspecified part of right bronchus or lung: Secondary | ICD-10-CM

## 2013-04-23 DIAGNOSIS — N200 Calculus of kidney: Secondary | ICD-10-CM | POA: Insufficient documentation

## 2013-04-23 DIAGNOSIS — M47814 Spondylosis without myelopathy or radiculopathy, thoracic region: Secondary | ICD-10-CM | POA: Insufficient documentation

## 2013-04-23 DIAGNOSIS — N210 Calculus in bladder: Secondary | ICD-10-CM | POA: Insufficient documentation

## 2013-04-23 DIAGNOSIS — C3490 Malignant neoplasm of unspecified part of unspecified bronchus or lung: Secondary | ICD-10-CM | POA: Insufficient documentation

## 2013-04-23 DIAGNOSIS — I700 Atherosclerosis of aorta: Secondary | ICD-10-CM | POA: Insufficient documentation

## 2013-04-23 DIAGNOSIS — I2510 Atherosclerotic heart disease of native coronary artery without angina pectoris: Secondary | ICD-10-CM | POA: Insufficient documentation

## 2013-04-23 DIAGNOSIS — K838 Other specified diseases of biliary tract: Secondary | ICD-10-CM | POA: Insufficient documentation

## 2013-04-23 DIAGNOSIS — M47817 Spondylosis without myelopathy or radiculopathy, lumbosacral region: Secondary | ICD-10-CM | POA: Insufficient documentation

## 2013-04-23 DIAGNOSIS — N400 Enlarged prostate without lower urinary tract symptoms: Secondary | ICD-10-CM | POA: Insufficient documentation

## 2013-04-23 MED ORDER — IOHEXOL 300 MG/ML  SOLN
100.0000 mL | Freq: Once | INTRAMUSCULAR | Status: AC | PRN
  Administered 2013-04-23: 100 mL via INTRAVENOUS

## 2013-04-24 ENCOUNTER — Telehealth: Payer: Self-pay | Admitting: Internal Medicine

## 2013-04-24 ENCOUNTER — Ambulatory Visit (HOSPITAL_BASED_OUTPATIENT_CLINIC_OR_DEPARTMENT_OTHER): Payer: BC Managed Care – PPO | Admitting: Internal Medicine

## 2013-04-24 ENCOUNTER — Encounter: Payer: Self-pay | Admitting: *Deleted

## 2013-04-24 ENCOUNTER — Ambulatory Visit (HOSPITAL_BASED_OUTPATIENT_CLINIC_OR_DEPARTMENT_OTHER): Payer: BC Managed Care – PPO

## 2013-04-24 ENCOUNTER — Telehealth: Payer: Self-pay | Admitting: *Deleted

## 2013-04-24 ENCOUNTER — Encounter: Payer: Self-pay | Admitting: Internal Medicine

## 2013-04-24 ENCOUNTER — Other Ambulatory Visit (HOSPITAL_BASED_OUTPATIENT_CLINIC_OR_DEPARTMENT_OTHER): Payer: BC Managed Care – PPO | Admitting: Lab

## 2013-04-24 VITALS — BP 154/94 | HR 81 | Temp 97.9°F | Resp 18 | Ht 65.0 in | Wt 142.5 lb

## 2013-04-24 DIAGNOSIS — C3491 Malignant neoplasm of unspecified part of right bronchus or lung: Secondary | ICD-10-CM

## 2013-04-24 DIAGNOSIS — C3430 Malignant neoplasm of lower lobe, unspecified bronchus or lung: Secondary | ICD-10-CM

## 2013-04-24 DIAGNOSIS — Z5111 Encounter for antineoplastic chemotherapy: Secondary | ICD-10-CM

## 2013-04-24 LAB — COMPREHENSIVE METABOLIC PANEL (CC13)
ALT: 24 U/L (ref 0–55)
AST: 26 U/L (ref 5–34)
Albumin: 3.7 g/dL (ref 3.5–5.0)
Calcium: 10.4 mg/dL (ref 8.4–10.4)
Chloride: 103 mEq/L (ref 98–109)
Potassium: 3.9 mEq/L (ref 3.5–5.1)

## 2013-04-24 LAB — CBC WITH DIFFERENTIAL/PLATELET
BASO%: 0.6 % (ref 0.0–2.0)
HCT: 37.5 % — ABNORMAL LOW (ref 38.4–49.9)
MCHC: 34.9 g/dL (ref 32.0–36.0)
MONO#: 0.1 10*3/uL (ref 0.1–0.9)
RBC: 4 10*6/uL — ABNORMAL LOW (ref 4.20–5.82)
WBC: 6.4 10*3/uL (ref 4.0–10.3)
lymph#: 0.8 10*3/uL — ABNORMAL LOW (ref 0.9–3.3)
nRBC: 0 % (ref 0–0)

## 2013-04-24 MED ORDER — SODIUM CHLORIDE 0.9 % IV SOLN
500.0000 mg/m2 | Freq: Once | INTRAVENOUS | Status: AC
  Administered 2013-04-24: 875 mg via INTRAVENOUS
  Filled 2013-04-24: qty 35

## 2013-04-24 MED ORDER — SODIUM CHLORIDE 0.9 % IV SOLN
Freq: Once | INTRAVENOUS | Status: AC
  Administered 2013-04-24: 13:00:00 via INTRAVENOUS

## 2013-04-24 MED ORDER — ONDANSETRON 8 MG/50ML IVPB (CHCC)
8.0000 mg | Freq: Once | INTRAVENOUS | Status: AC
  Administered 2013-04-24: 8 mg via INTRAVENOUS

## 2013-04-24 MED ORDER — SODIUM CHLORIDE 0.9 % IJ SOLN
10.0000 mL | INTRAMUSCULAR | Status: DC | PRN
  Administered 2013-04-24: 10 mL
  Filled 2013-04-24: qty 10

## 2013-04-24 MED ORDER — DEXAMETHASONE SODIUM PHOSPHATE 10 MG/ML IJ SOLN
10.0000 mg | Freq: Once | INTRAMUSCULAR | Status: AC
  Administered 2013-04-24: 10 mg via INTRAVENOUS

## 2013-04-24 MED ORDER — HEPARIN SOD (PORK) LOCK FLUSH 100 UNIT/ML IV SOLN
500.0000 [IU] | Freq: Once | INTRAVENOUS | Status: AC | PRN
  Administered 2013-04-24: 500 [IU]
  Filled 2013-04-24: qty 5

## 2013-04-24 NOTE — Telephone Encounter (Signed)
gv and printed appt sched and avs....emailed MW to add tx.

## 2013-04-24 NOTE — Patient Instructions (Addendum)
No significant evidence for disease progression except for mildly increased right middle lobe nodule. Continue maintenance therapy with single agent Alimta. Followup visit in 3 weeks

## 2013-04-24 NOTE — Patient Instructions (Addendum)
[facility] Discharge Instructions for Patients Receiving Chemotherapy  Today you received the following chemotherapy agents Alimta  To help prevent nausea and vomiting after your treatment, we encourage you to take your nausea medication as prescribed.   If you develop nausea and vomiting that is not controlled by your nausea medication, call the clinic.   BELOW ARE SYMPTOMS THAT SHOULD BE REPORTED IMMEDIATELY:  *FEVER GREATER THAN 100.5 F  *CHILLS WITH OR WITHOUT FEVER  NAUSEA AND VOMITING THAT IS NOT CONTROLLED WITH YOUR NAUSEA MEDICATION  *UNUSUAL SHORTNESS OF BREATH  *UNUSUAL BRUISING OR BLEEDING  TENDERNESS IN MOUTH AND THROAT WITH OR WITHOUT PRESENCE OF ULCERS  *URINARY PROBLEMS  *BOWEL PROBLEMS  UNUSUAL RASH Items with * indicate a potential emergency and should be followed up as soon as possible.  Feel free to call the clinic you have any questions or concerns. The clinic phone number is [phone].    

## 2013-04-24 NOTE — Progress Notes (Signed)
[facility] Telephone:([phone]) [phone]   [phone]  OFFICE PROGRESS NOTE  Thomas Cotta, Thomas May [address] [place] [address]  DIAGNOSIS: Metastatic non-small cell lung cancer favoring adenocarcinoma with negative EGFR mutation, negative ALK gene translocation and negative ROS1 diagnosed in August of 2013   PRIOR THERAPY: Status post 4 cycles of systemic chemotherapy with carboplatin and Alimta with partial response at [facility]. Last dose was given in November of 2013.   CURRENT THERAPY: Maintenance chemotherapy with Alimta 500 mg/M2 started on 09/26/2012, status post 6 cycles.    INTERVAL HISTORY: Thomas May 67 y.o. male returns to the clinic today for followup visit accompanied by his son, daughter and his [place] interpreter. The patient is feeling fine today with no specific complaints except for mild fatigue. He also has 1-2 days of nausea after his chemotherapy. He denied having any significant weight loss or night sweats. The patient denied having any chest pain, shortness breath, cough or hemoptysis. He denied having any fever or chills. He had repeat CT scan of the chest, abdomen and pelvis performed recently and he is here for evaluation and discussion of his scan results.  MEDICAL HISTORY: Past Medical History  Diagnosis Date  . Tobacco use   . Lung mass   . BPH (benign prostatic hyperplasia)     ALLERGIES:  has No Known Allergies.  MEDICATIONS:  Current Outpatient Prescriptions  Medication Sig Dispense Refill  . dexamethasone (DECADRON) 4 MG tablet 4 mg by mouth twice a day day before, day of and day after the chemotherapy every 3 weeks  40 tablet  0  . docusate sodium (COLACE) 100 MG capsule Take 100 mg by mouth 2 (two) times daily.      . folic acid (FOLVITE) 1 MG tablet Take 1 tablet (1 mg total) by mouth daily.  30 tablet  2  . lidocaine-prilocaine (EMLA) cream Apply topically as needed. Cover with plastic  wrap 1.5 hours before chemo.  30 g  1  . megestrol (MEGACE ORAL) 40 MG/ML suspension Take 20 mLs (800 mg total) by mouth daily.  480 mL  0  . Multiple Vitamin (MULTIVITAMIN) tablet Take 1 tablet by mouth daily.      . prochlorperazine (COMPAZINE) 10 MG tablet Take 5 mg by mouth every 6 (six) hours as needed. Take 0.5 tablets (5 mg total) by mouth every 6 (six) hours as needed for Nausea.      . Saw Palmetto, Serenoa repens, (SAW PALMETTO PO) Take 1 capsule by mouth daily.      Marland Kitchen terazosin (HYTRIN) 1 MG capsule Take 1 capsule (1 mg total) by mouth at bedtime.  30 capsule  3   No current facility-administered medications for this visit.    SURGICAL HISTORY:  Past Surgical History  Procedure Laterality Date  . Video bronchoscopy  05/31/2012    Procedure: VIDEO BRONCHOSCOPY WITH FLUORO;  Surgeon: Nyoka Cowden, Thomas May;  Location: [facility];  Service: Endoscopy;  Laterality: Bilateral;    REVIEW OF SYSTEMS:  A comprehensive review of systems was negative except for: Constitutional: positive for fatigue   PHYSICAL EXAMINATION: General appearance: alert, cooperative and no distress Head: Normocephalic, without obvious abnormality, atraumatic Neck: no adenopathy Lymph nodes: Cervical, supraclavicular, and axillary nodes normal. Resp: clear to auscultation bilaterally Cardio: regular rate and rhythm, S1, S2 normal, no murmur, click, rub or gallop GI: soft, non-tender; bowel sounds normal; no masses,  no organomegaly Extremities:  extremities normal, atraumatic, no cyanosis or edema Neurologic: Alert and oriented X 3, normal strength and tone. Normal symmetric reflexes. Normal coordination and gait  ECOG PERFORMANCE STATUS: 1 - Symptomatic but completely ambulatory  There were no vitals taken for this visit.  LABORATORY DATA: Lab Results  Component Value Date   WBC 5.8 04/03/2013   HGB 12.5* 04/03/2013   HCT 36.3* 04/03/2013   MCV 94.5 04/03/2013   PLT 273 04/03/2013      Chemistry        Component Value Date/Time   NA 139 04/03/2013 1155   NA 141 06/02/2012 1611   K 3.8 04/03/2013 1155   K 3.9 06/02/2012 1611   CL 109* 04/03/2013 1155   CL 108 06/02/2012 1611   CO2 23 04/03/2013 1155   CO2 24 06/02/2012 1611   BUN 10.8 04/03/2013 1155   BUN 13 06/02/2012 1611   CREATININE 1.0 04/03/2013 1155   CREATININE 0.65 06/02/2012 1611      Component Value Date/Time   CALCIUM 9.9 04/03/2013 1155   CALCIUM 9.6 06/02/2012 1611   ALKPHOS 97 04/03/2013 1155   AST 26 04/03/2013 1155   ALT 51 04/03/2013 1155   BILITOT 0.43 04/03/2013 1155       RADIOGRAPHIC STUDIES: Ct Chest W Contrast  04/23/2013   *RADIOLOGY REPORT*  Clinical Data:  Restaging lung cancer  CT CHEST, ABDOMEN AND PELVIS WITH CONTRAST  Technique:  Multidetector CT imaging of the chest, abdomen and pelvis was performed following the standard protocol during bolus administration of intravenous contrast.  Contrast: OMNIPAQUE IOHEXOL 300 MG/ML  SOLN  Comparison:  02/16/2013 and 12/18/2012.    CT CHEST  Findings:  There is no pleural effusion identified.  The right middle lobe pulmonary lesion measures 1.8 x 2.1 cm, image 32/series 4.  This is compared with 1.6 x 2.3 cm. The smaller satellite the nodule in the right middle lobe measures 8 mm, image 30/series 4. This is compared with 4 mm previously.  Right upper lobe nodule measures 5 mm, image 19/series 4.  This is unchanged from previous exam.  The superior segment of the right lower lobe nodule measures 6 mm, image 21/series 4.  This is compared with 5 mm previously.  The heart size appears normal.  There is no pericardial effusion. Extensive calcification involving the LAD and left circumflex coronary artery noted.  No axillary or supraclavicular adenopathy noted.  Review of the visualized osseous structures is significant for mild thoracic spondylosis.  No aggressive lytic or sclerotic bone lesions noted.  IMPRESSION:  1.  Primary right middle lobe lesion is stable compared with  previous exam. 2.  The small satellite nodule within the right middle lobe increased in size compared with the previous examination.  This now measures 8 mm versus 4 mm previously. 3.  Other small nodules are unchanged. 4.  Atherosclerosis noted.    CT ABDOMEN AND PELVIS  Findings:  There is a stable low attenuation structure within the right hepatic lobe which likely represents a cyst.  This measures 1 cm, image 43/series 2.  Mild intrahepatic biliary dilatation appears similar to previous exam.  The gallbladder appears normal. The common bile duct is normal caliber.  Normal appearance of the pancreas.  The spleen is unremarkable.  The adrenal glands are both normal.  Stone within the upper pole the left kidney measures 4 mm, image 58/series 2.  This is stable from previous exam.  Right kidney appears normal.  There is a multiple  stones identified within the lumen of the bladder.  There is a diverticula arising from the left posterior bladder base. This contains a 1.9 cm stone.  The prostate gland enlargement is again noted with mass effect upon the bladder base.  Calcified atherosclerotic disease affects the abdominal aorta.  No aneurysm.  There is no upper abdominal adenopathy.  There is no pelvic or inguinal adenopathy.  The stomach is normal.  The small bowel loops are unremarkable. Normal appearance of the appendix.  The colon is unremarkable. There are no free fluid or abnormal fluid collections within the abdomen or pelvis.  Review of the visualized osseous structures is significant for mild lumbar spondylosis.  IMPRESSION:  1.  No acute findings within the abdomen or pelvis. 2.  Bladder calculi and bladder diverticula. 3.  Prostate gland enlargement.   Original Report Authenticated By: Signa Kell, M.D.   ASSESSMENT AND PLAN: This is a very pleasant 67 years old Asian male with metastatic non-small cell lung cancer currently undergoing maintenance chemotherapy with single agent Alimta status post 6  cycles. The patient is tolerating his treatment fairly well with no significant evidence for disease progression except for questionable increase in right middle lobe nodule. I discussed the scan results with the patient and his family and showed them the images. I recommended for the patient to continue with his maintenance chemotherapy with single agent Alimta as scheduled. I will continue to monitor the right middle lobe nodule closely on the upcoming scan. He was advised to call immediately if he has any concerning symptoms in the interval.  All questions were answered. The patient knows to call the clinic with any problems, questions or concerns. We can certainly see the patient much sooner if necessary.  I spent 15 minutes counseling the patient face to face. The total time spent in the appointment was 25 minutes.

## 2013-04-24 NOTE — Telephone Encounter (Signed)
Per staff phone call and POF I have schedueld appts.  JMW  

## 2013-04-25 NOTE — Progress Notes (Signed)
[facility] Healthcare Advance Directives Clinical Social Work  Clinical Social Work was referred by patient's interpreter to review and complete healthcare advance directives.  Clinical Social Worker met with patient, interpreter, and patient's children Thomas May and Thomas May in infusion room to review advance directive documents.  The patient designated his son Thomas May as their primary healthcare agent and daughter Thomas May as their secondary agent.  Patient also completed healthcare living will.  [name] designated in his living will that he desired his life not be prolonged by life prolonging measures in situations indicated in his living will (if he has a condition that cannot be cured and will result in his death in a relatively short period of time, if he becomes unconscious and will never regain his consciousness, and if he suffers from advanced dementia and this is not going to get better).  [name] shared that he desires a natural death and would like to be buried.  [name] children expressed feeling relieved that we were able to facilitate discussion around advance care planning. To review patient's advance directives, they may be accessed under Chart Review in the Media Tab.  CSW will notify patient's medical oncologist of today's visit.  Clinical Social Worker notarized documents and made copies for patient/family. Clinical Social Worker will send documents to medical records to be scanned into patient's chart. Clinical Social Worker encouraged patient/family to contact with any additional questions or concerns.  Kathrin Penner, MSW, LCSW Clinical Social Worker [facility] [phone]

## 2013-05-14 ENCOUNTER — Encounter: Payer: Self-pay | Admitting: Pharmacist

## 2013-05-15 ENCOUNTER — Other Ambulatory Visit (HOSPITAL_BASED_OUTPATIENT_CLINIC_OR_DEPARTMENT_OTHER): Payer: BC Managed Care – PPO

## 2013-05-15 ENCOUNTER — Other Ambulatory Visit: Payer: BC Managed Care – PPO | Admitting: Lab

## 2013-05-15 ENCOUNTER — Telehealth: Payer: Self-pay | Admitting: Internal Medicine

## 2013-05-15 ENCOUNTER — Other Ambulatory Visit: Payer: Self-pay | Admitting: Internal Medicine

## 2013-05-15 ENCOUNTER — Ambulatory Visit (HOSPITAL_BASED_OUTPATIENT_CLINIC_OR_DEPARTMENT_OTHER): Payer: BC Managed Care – PPO

## 2013-05-15 ENCOUNTER — Ambulatory Visit (HOSPITAL_BASED_OUTPATIENT_CLINIC_OR_DEPARTMENT_OTHER): Payer: BC Managed Care – PPO | Admitting: Physician Assistant

## 2013-05-15 ENCOUNTER — Encounter: Payer: Self-pay | Admitting: Physician Assistant

## 2013-05-15 VITALS — BP 150/84 | HR 79 | Temp 97.9°F | Resp 20 | Ht 65.0 in | Wt 141.0 lb

## 2013-05-15 DIAGNOSIS — C3430 Malignant neoplasm of lower lobe, unspecified bronchus or lung: Secondary | ICD-10-CM

## 2013-05-15 DIAGNOSIS — C3490 Malignant neoplasm of unspecified part of unspecified bronchus or lung: Secondary | ICD-10-CM

## 2013-05-15 DIAGNOSIS — C3491 Malignant neoplasm of unspecified part of right bronchus or lung: Secondary | ICD-10-CM

## 2013-05-15 DIAGNOSIS — R5383 Other fatigue: Secondary | ICD-10-CM

## 2013-05-15 DIAGNOSIS — Z5111 Encounter for antineoplastic chemotherapy: Secondary | ICD-10-CM

## 2013-05-15 LAB — COMPREHENSIVE METABOLIC PANEL (CC13)
Albumin: 3.3 g/dL — ABNORMAL LOW (ref 3.5–5.0)
Alkaline Phosphatase: 92 U/L (ref 40–150)
BUN: 10 mg/dL (ref 7.0–26.0)
CO2: 24 mEq/L (ref 22–29)
Glucose: 88 mg/dl (ref 70–140)
Potassium: 3.5 mEq/L (ref 3.5–5.1)
Sodium: 141 mEq/L (ref 136–145)
Total Bilirubin: 0.45 mg/dL (ref 0.20–1.20)
Total Protein: 7.5 g/dL (ref 6.4–8.3)

## 2013-05-15 LAB — CBC WITH DIFFERENTIAL/PLATELET
Basophils Absolute: 0 10*3/uL (ref 0.0–0.1)
Eosinophils Absolute: 0.3 10*3/uL (ref 0.0–0.5)
LYMPH%: 34 % (ref 14.0–49.0)
MCH: 32.4 pg (ref 27.2–33.4)
MCHC: 33.9 g/dL (ref 32.0–36.0)
MONO#: 0.6 10*3/uL (ref 0.1–0.9)
MONO%: 9.6 % (ref 0.0–14.0)

## 2013-05-15 MED ORDER — DEXAMETHASONE SODIUM PHOSPHATE 10 MG/ML IJ SOLN
10.0000 mg | Freq: Once | INTRAMUSCULAR | Status: AC
  Administered 2013-05-15: 10 mg via INTRAVENOUS

## 2013-05-15 MED ORDER — SODIUM CHLORIDE 0.9 % IV SOLN
Freq: Once | INTRAVENOUS | Status: AC
  Administered 2013-05-15: 13:00:00 via INTRAVENOUS

## 2013-05-15 MED ORDER — HEPARIN SOD (PORK) LOCK FLUSH 100 UNIT/ML IV SOLN
500.0000 [IU] | Freq: Once | INTRAVENOUS | Status: AC | PRN
  Administered 2013-05-15: 500 [IU]
  Filled 2013-05-15: qty 5

## 2013-05-15 MED ORDER — ONDANSETRON 8 MG/50ML IVPB (CHCC)
8.0000 mg | Freq: Once | INTRAVENOUS | Status: AC
  Administered 2013-05-15: 8 mg via INTRAVENOUS

## 2013-05-15 MED ORDER — CYANOCOBALAMIN 1000 MCG/ML IJ SOLN
1000.0000 ug | Freq: Once | INTRAMUSCULAR | Status: AC
  Administered 2013-05-15: 1000 ug via INTRAMUSCULAR

## 2013-05-15 MED ORDER — SODIUM CHLORIDE 0.9 % IV SOLN
500.0000 mg/m2 | Freq: Once | INTRAVENOUS | Status: AC
  Administered 2013-05-15: 875 mg via INTRAVENOUS
  Filled 2013-05-15: qty 35

## 2013-05-15 MED ORDER — SODIUM CHLORIDE 0.9 % IJ SOLN
10.0000 mL | INTRAMUSCULAR | Status: DC | PRN
  Administered 2013-05-15: 10 mL
  Filled 2013-05-15: qty 10

## 2013-05-15 NOTE — Telephone Encounter (Signed)
gv dtr appt schedule for August and September.

## 2013-05-15 NOTE — Patient Instructions (Addendum)
[facility] Discharge Instructions for Patients Receiving Chemotherapy  Today you received the following chemotherapy agents:  Alimta  To help prevent nausea and vomiting after your treatment, we encourage you to take your nausea medication as ordered per MD.   If you develop nausea and vomiting that is not controlled by your nausea medication, call the clinic.   BELOW ARE SYMPTOMS THAT SHOULD BE REPORTED IMMEDIATELY:  *FEVER GREATER THAN 100.5 F  *CHILLS WITH OR WITHOUT FEVER  NAUSEA AND VOMITING THAT IS NOT CONTROLLED WITH YOUR NAUSEA MEDICATION  *UNUSUAL SHORTNESS OF BREATH  *UNUSUAL BRUISING OR BLEEDING  TENDERNESS IN MOUTH AND THROAT WITH OR WITHOUT PRESENCE OF ULCERS  *URINARY PROBLEMS  *BOWEL PROBLEMS  UNUSUAL RASH Items with * indicate a potential emergency and should be followed up as soon as possible.  Feel free to call the clinic you have any questions or concerns. The clinic phone number is [phone].    

## 2013-05-15 NOTE — Patient Instructions (Addendum)
Follow up in 3 weeks, prior to your next scheduled cycle of chemotherapy 

## 2013-05-16 NOTE — Progress Notes (Addendum)
[facility] Telephone:([phone]   [phone]  SHARED VISIT PROGRESS NOTE  [name], MD [address] [place] [address]  DIAGNOSIS: Metastatic non-small cell lung cancer favoring adenocarcinoma with negative EGFR mutation, negative ALK gene translocation and negative ROS1 diagnosed in August of 2013   PRIOR THERAPY: Status post 4 cycles of systemic chemotherapy with carboplatin and Alimta with partial response at [facility]. Last dose was given in November of 2013.   CURRENT THERAPY: Maintenance chemotherapy with Alimta 500 mg/M2 started on 09/26/2012, status post 7 cycles.    INTERVAL HISTORY: Thomas May 67 y.o. male returns to the clinic today for followup visit accompanied by his daughter and his [place] interpreter. The patient is feeling fine today with no specific complaints except for mild fatigue. He also has 1-2 days of nausea after his chemotherapy. He denied having any significant weight loss or night sweats. The patient denied having any chest pain, shortness breath, cough or hemoptysis. He denied having any fever or chills. He did hurt his left arm and shoulder, lifting something heavy. The arm is still sore but is gradually getting better. He states he may be ready for a "break" from chemotherapy after his next restaging CT scan.  MEDICAL HISTORY: Past Medical History  Diagnosis Date  . Tobacco use   . Lung mass   . BPH (benign prostatic hyperplasia)     ALLERGIES:  has No Known Allergies.  MEDICATIONS:  Current Outpatient Prescriptions  Medication Sig Dispense Refill  . dexamethasone (DECADRON) 4 MG tablet TAKE 1 TABLET BY MOUTH TWICE A DAY DAY BEFORE DAY OF AND DAY AFTER THE CHEMO THEREAPY EVERY 3 WEEKS  40 tablet  0  . docusate sodium (COLACE) 100 MG capsule Take 100 mg by mouth 2 (two) times daily.      . folic acid (FOLVITE) 1 MG tablet Take 1 tablet (1 mg total) by mouth daily.  30 tablet  2    . lidocaine-prilocaine (EMLA) cream Apply topically as needed. Cover with plastic wrap 1.5 hours before chemo.  30 g  1  . megestrol (MEGACE ORAL) 40 MG/ML suspension Take 20 mLs (800 mg total) by mouth daily.  480 mL  0  . Multiple Vitamin (MULTIVITAMIN) tablet Take 1 tablet by mouth daily.      . prochlorperazine (COMPAZINE) 10 MG tablet Take 5 mg by mouth every 6 (six) hours as needed. Take 0.5 tablets (5 mg total) by mouth every 6 (six) hours as needed for Nausea.      . Saw Palmetto, Serenoa repens, (SAW PALMETTO PO) Take 1 capsule by mouth daily.      Marland Kitchen terazosin (HYTRIN) 1 MG capsule Take 1 capsule (1 mg total) by mouth at bedtime.  30 capsule  3   No current facility-administered medications for this visit.    SURGICAL HISTORY:  Past Surgical History  Procedure Laterality Date  . Video bronchoscopy  05/31/2012    Procedure: VIDEO BRONCHOSCOPY WITH FLUORO;  Surgeon: Nyoka Cowden, MD;  Location: [facility];  Service: Endoscopy;  Laterality: Bilateral;    REVIEW OF SYSTEMS:  Pertinent items are noted in HPI.   PHYSICAL EXAMINATION: General appearance: alert, cooperative and no distress Head: Normocephalic, without obvious abnormality, atraumatic Neck: no adenopathy Lymph nodes: Cervical, supraclavicular, and axillary nodes normal. Resp: clear to auscultation bilaterally Cardio: regular rate and rhythm, S1, S2 normal, no murmur, click, rub or gallop GI: soft, non-tender;  bowel sounds normal; no masses,  no organomegaly Extremities: extremities normal, atraumatic, no cyanosis or edema Neurologic: Alert and oriented X 3, normal strength and tone. Normal symmetric reflexes. Normal coordination and gait Generalized tenderness left scapula and shoulder girdle.  No erythema or warmth  ECOG PERFORMANCE STATUS: 1 - Symptomatic but completely ambulatory  Blood pressure 150/84, pulse 79, temperature 97.9 F (36.6 C), temperature source Oral, resp. rate 20, height 5\' 5"  (1.651 m),  weight 141 lb (63.957 kg).  LABORATORY DATA: Lab Results  Component Value Date   WBC 5.9 05/15/2013   HGB 12.3* 05/15/2013   HCT 36.3* 05/15/2013   MCV 95.5 05/15/2013   PLT 270 05/15/2013      Chemistry      Component Value Date/Time   NA 141 05/15/2013 1202   NA 141 06/02/2012 1611   K 3.5 05/15/2013 1202   K 3.9 06/02/2012 1611   CL 109* 04/03/2013 1155   CL 108 06/02/2012 1611   CO2 24 05/15/2013 1202   CO2 24 06/02/2012 1611   BUN 10.0 05/15/2013 1202   BUN 13 06/02/2012 1611   CREATININE 0.9 05/15/2013 1202   CREATININE 0.65 06/02/2012 1611      Component Value Date/Time   CALCIUM 10.0 05/15/2013 1202   CALCIUM 9.6 06/02/2012 1611   ALKPHOS 92 05/15/2013 1202   AST 33 05/15/2013 1202   ALT 28 05/15/2013 1202   BILITOT 0.45 05/15/2013 1202       RADIOGRAPHIC STUDIES: Ct Chest W Contrast  04/23/2013   *RADIOLOGY REPORT*  Clinical Data:  Restaging lung cancer  CT CHEST, ABDOMEN AND PELVIS WITH CONTRAST  Technique:  Multidetector CT imaging of the chest, abdomen and pelvis was performed following the standard protocol during bolus administration of intravenous contrast.  Contrast: OMNIPAQUE IOHEXOL 300 MG/ML  SOLN  Comparison:  02/16/2013 and 12/18/2012.    CT CHEST  Findings:  There is no pleural effusion identified.  The right middle lobe pulmonary lesion measures 1.8 x 2.1 cm, image 32/series 4.  This is compared with 1.6 x 2.3 cm. The smaller satellite the nodule in the right middle lobe measures 8 mm, image 30/series 4. This is compared with 4 mm previously.  Right upper lobe nodule measures 5 mm, image 19/series 4.  This is unchanged from previous exam.  The superior segment of the right lower lobe nodule measures 6 mm, image 21/series 4.  This is compared with 5 mm previously.  The heart size appears normal.  There is no pericardial effusion. Extensive calcification involving the LAD and left circumflex coronary artery noted.  No axillary or supraclavicular adenopathy noted.  Review of the  visualized osseous structures is significant for mild thoracic spondylosis.  No aggressive lytic or sclerotic bone lesions noted.  IMPRESSION:  1.  Primary right middle lobe lesion is stable compared with previous exam. 2.  The small satellite nodule within the right middle lobe increased in size compared with the previous examination.  This now measures 8 mm versus 4 mm previously. 3.  Other small nodules are unchanged. 4.  Atherosclerosis noted.    CT ABDOMEN AND PELVIS  Findings:  There is a stable low attenuation structure within the right hepatic lobe which likely represents a cyst.  This measures 1 cm, image 43/series 2.  Mild intrahepatic biliary dilatation appears similar to previous exam.  The gallbladder appears normal. The common bile duct is normal caliber.  Normal appearance of the pancreas.  The spleen is unremarkable.  The adrenal glands are both normal.  Stone within the upper pole the left kidney measures 4 mm, image 58/series 2.  This is stable from previous exam.  Right kidney appears normal.  There is a multiple stones identified within the lumen of the bladder.  There is a diverticula arising from the left posterior bladder base. This contains a 1.9 cm stone.  The prostate gland enlargement is again noted with mass effect upon the bladder base.  Calcified atherosclerotic disease affects the abdominal aorta.  No aneurysm.  There is no upper abdominal adenopathy.  There is no pelvic or inguinal adenopathy.  The stomach is normal.  The small bowel loops are unremarkable. Normal appearance of the appendix.  The colon is unremarkable. There are no free fluid or abnormal fluid collections within the abdomen or pelvis.  Review of the visualized osseous structures is significant for mild lumbar spondylosis.  IMPRESSION:  1.  No acute findings within the abdomen or pelvis. 2.  Bladder calculi and bladder diverticula. 3.  Prostate gland enlargement.   Original Report Authenticated By: Signa Kell,  M.D.   ASSESSMENT AND PLAN: This is a very pleasant 67 years old Asian male with metastatic non-small cell lung cancer currently undergoing maintenance chemotherapy with single agent Alimta status post 7 cycles. The patient is tolerating his treatment fairly well with no significant evidence for disease progression except for questionable increase in right middle lobe nodule. The patient was discussed with and seen by Dr. Arbutus Ped. He will proceed with cycle #8 today as scheduled. He will return in 3 weeks with a repeat CBC Differential and CMET , prior to his next scheduled cycle of maintenance chemotherapy with single agent Alimta. He may use local heat and tylenol with occasional careful use of Advil as directed for his left shoulder pain.  Sherline Eberwein E, PA-C   He was advised to call immediately if he has any concerning symptoms in the interval.  All questions were answered. The patient knows to call the clinic with any problems, questions or concerns. We can certainly see the patient much sooner if necessary.  ADDENDUM: Hematology/Oncology Attending: I have a face to face encounter with the patient. I recommended his care plan. The patient was accompanied his daughter and his [place] interpreter. He has a history of metastatic non-small cell lung cancer and currently on maintenance treatment with single agent Alimta status post 7 cycles. The patient is tolerating his treatment fairly well with no significant adverse effects. We'll proceed with cycle #8 of his treatment today as scheduled. The patient would come back for follow up visit in 3 weeks with the next treatment. Lajuana Matte., MD 05/15/2013

## 2013-05-21 ENCOUNTER — Other Ambulatory Visit: Payer: Self-pay | Admitting: Internal Medicine

## 2013-05-21 ENCOUNTER — Other Ambulatory Visit: Payer: Self-pay | Admitting: Family Medicine

## 2013-05-21 MED ORDER — TERAZOSIN HCL 1 MG PO CAPS: 1.0000 mg | ORAL_CAPSULE | Freq: Every day | ORAL | Status: DC

## 2013-05-21 NOTE — Telephone Encounter (Signed)
Patient is requesting a refill of terazosin to be sent to [facility]

## 2013-05-21 NOTE — Telephone Encounter (Signed)
Please advise refill? Last time patient was in was 07-10-12?

## 2013-05-21 NOTE — Telephone Encounter (Signed)
pts daughter informed. RX sent and call transferred to Marj to schedule appt

## 2013-05-21 NOTE — Telephone Encounter (Signed)
Can refill with 30 day supply once but warn him it almost a year since he was seen, he needs to be seen at least annually to have meds continued

## 2013-05-28 ENCOUNTER — Ambulatory Visit (INDEPENDENT_AMBULATORY_CARE_PROVIDER_SITE_OTHER): Payer: BC Managed Care – PPO | Admitting: Physician Assistant

## 2013-05-28 ENCOUNTER — Encounter: Payer: Self-pay | Admitting: Physician Assistant

## 2013-05-28 VITALS — BP 140/86 | HR 77 | Temp 98.7°F | Resp 14 | Wt 143.2 lb

## 2013-05-28 DIAGNOSIS — D171 Benign lipomatous neoplasm of skin and subcutaneous tissue of trunk: Secondary | ICD-10-CM | POA: Insufficient documentation

## 2013-05-28 DIAGNOSIS — N400 Enlarged prostate without lower urinary tract symptoms: Secondary | ICD-10-CM

## 2013-05-28 DIAGNOSIS — Z0000 Encounter for general adult medical examination without abnormal findings: Secondary | ICD-10-CM

## 2013-05-28 DIAGNOSIS — Z299 Encounter for prophylactic measures, unspecified: Secondary | ICD-10-CM

## 2013-05-28 DIAGNOSIS — D1779 Benign lipomatous neoplasm of other sites: Secondary | ICD-10-CM

## 2013-05-28 LAB — URINALYSIS, ROUTINE W REFLEX MICROSCOPIC
Glucose, UA: NEGATIVE mg/dL
Leukocytes, UA: NEGATIVE
Nitrite: NEGATIVE
Specific Gravity, Urine: 1.016 (ref 1.005–1.030)
pH: 7 (ref 5.0–8.0)

## 2013-05-28 LAB — HEPATIC FUNCTION PANEL
ALT: 42 U/L (ref 0–53)
AST: 27 U/L (ref 0–37)
Albumin: 3.8 g/dL (ref 3.5–5.2)
Alkaline Phosphatase: 93 U/L (ref 39–117)
Bilirubin, Direct: 0.1 mg/dL (ref 0.0–0.3)
Total Bilirubin: 0.4 mg/dL (ref 0.3–1.2)

## 2013-05-28 LAB — LIPID PANEL
HDL: 52 mg/dL (ref >39)
Total CHOL/HDL Ratio: 3.9 Ratio
Triglycerides: 168 mg/dL — ABNORMAL HIGH (ref <150)

## 2013-05-28 NOTE — Patient Instructions (Signed)
Please obtain lab work today.  I will call you with the results.  Please follow-up with oncologist at your scheduled appointments.     Benign Prostatic Hyperplasia You have an enlarged prostate. This is common in elderly males. It is called BPH. This stands for benign prostate hyperplasia. The prostate gland is located in base of the bladder. When it grows, the prostate blocks the urethra. This is the tube which drains urine from the bladder.  SYMPTOMS  Weak urine stream.  Dribbling.  Feeling like the bladder has not emptied completely.  Difficulty starting urination.  Getting up frequently at night to urinate.  Urinating more frequently during the day. Complete urinary blockage or severe pain with urination requires immediate attention. DIAGNOSIS   Your caregiver often has a good idea what is wrong by taking a history and doing a physical exam.  Special x-rays may be done. TREATMENT   For mild problems, no treatment may be necessary.  If the problems are moderate, medications may provide relief. Some of these work by making the prostate gland smaller. The herb saw palmetto is commonly used.  If complete blockage occurs, a Foley catheter is usually left in place for a few days.  Surgery is often needed for more severe problems. TURP is the prostate surgery for BPH which is done through the urethra. TURP stands for transurethral resection of the prostate. It involves cutting away chips from the prostate. It is done by removing chips so that they can come out through the penis.  Techniques using heat, microwave and laser to remove the prostate blockage are also being used. HOME CARE INSTRUCTIONS   Give yourself time when you urinate.  Stay away from alcohol.  Beverages containing caffeine such as coffee, tea and colas can make the problems worse.  Decongestants, antihistamines, and some prescription medicines can also make the problem worse.  Follow up with your caregiver  for further treatment as recommended. SEEK IMMEDIATE MEDICAL CARE IF:   You develop increased pain with urination or are unable to pass your water.  You develop severe abdominal pain, vomiting, a high fever, or fainting.  You develop back pain or blood in your urine. MAKE SURE YOU:   Understand these instructions.  Will watch your condition.  Will get help right away if you are not doing well or get worse. Document Released: 09/27/2005 Document Revised: 12/20/2011 Document Reviewed: 06/02/2007 [facility] Patient Information 2014 [place], [place].

## 2013-05-28 NOTE — Progress Notes (Signed)
Patient ID: Thomas May, male   DOB: [birth year], 67 y.o.   MRN: [id number]   Patient presents to clinic today for medication refill.  Patient is a 67 year-old gentleman with metastatic lung cancer.  Patient has not been seen by this clinic in 1 year.  Patient does not speak fluent english -- daughter and an interpretor are present for the interview.  Patient sees Oncologist every 3 weeks.  Patient's daughter endorses his condition is stable.  Is on 10th round of chemotherapy.  Has some difficulty with constipation 2/2 treatment but uses milk of magnesia.  Otherwise doing well with treatment.  Patient has history of BPH with h/o urology workup in the past.  Has been given Terazosin 1 mg at bedtime for his symptoms, but states he has been out of his medication. Patient takes saw palmetto as well for symptoms. Patient still having some difficulty with urinary stream, and nocturia x 4-5.  Terazosin refilled on 05/21/13.  Notices improvement while taking both medications simultaneously.  Past Medical History  Diagnosis Date  . Tobacco use   . Lung mass   . BPH (benign prostatic hyperplasia)    Current Outpatient Prescriptions on File Prior to Visit  Medication Sig Dispense Refill  . dexamethasone (DECADRON) 4 MG tablet TAKE 1 TABLET BY MOUTH TWICE A DAY DAY BEFORE DAY OF AND DAY AFTER THE CHEMO THEREAPY EVERY 3 WEEKS  40 tablet  0  . docusate sodium (COLACE) 100 MG capsule Take 100 mg by mouth 2 (two) times daily.      . folic acid (FOLVITE) 1 MG tablet Take 1 tablet (1 mg total) by mouth daily.  30 tablet  2  . lidocaine-prilocaine (EMLA) cream Apply topically as needed. Cover with plastic wrap 1.5 hours before chemo.  30 g  1  . Multiple Vitamin (MULTIVITAMIN) tablet Take 1 tablet by mouth daily.      . prochlorperazine (COMPAZINE) 10 MG tablet Take 5 mg by mouth every 6 (six) hours as needed. Take 0.5 tablets (5 mg total) by mouth every 6 (six) hours as needed for Nausea.      . Saw Palmetto, Serenoa  repens, (SAW PALMETTO PO) Take 1 capsule by mouth daily.      Marland Kitchen terazosin (HYTRIN) 1 MG capsule Take 1 capsule (1 mg total) by mouth at bedtime.  30 capsule  0   No current facility-administered medications on file prior to visit.   No Known Allergies Family History  Problem Relation Age of Onset  . Ulcers Other   . Coronary artery disease Neg Hx    History   Social History  . Marital Status: Married    Spouse Name: N/A    Number of Children: 2  . Years of Education: N/A   Occupational History  . [employer] Owner    Social History Main Topics  . Smoking status: Former Smoker -- 0.50 packs/day for 40 years    Types: Cigarettes    Quit date: 05/11/2012  . Smokeless tobacco: Never Used     Comment: Patient is using Nicotine patches.  . Alcohol Use: 1.5 oz/week    3 drink(s) per week     Comment: three times weekly  . Drug Use: No  . Sexual Activity: None   Other Topics Concern  . None   Social History Narrative   Occupation: Midwife   Married 34 years   1 son   1 daughter   Review of Systems  Constitutional: Positive for  malaise/fatigue. Negative for fever and chills.  Cardiovascular: Negative for chest pain and palpitations.  Gastrointestinal: Positive for nausea and constipation. Negative for vomiting, abdominal pain and diarrhea.  Genitourinary: Positive for frequency. Negative for dysuria, urgency, hematuria and flank pain.  Neurological: Negative for headaches.   Filed Vitals:   05/28/13 1521  BP: 140/86  Pulse: 77  Temp: 98.7 F (37.1 C)  Resp: 14    Physical Exam  Vitals reviewed. Constitutional: He is oriented to person, place, and time.  Well-developed, underweight gentleman sitting comfortably on examination table in no acute distress.  HENT:  Head: Normocephalic and atraumatic.  Right Ear: External ear normal.  Left Ear: External ear normal.  Nose: Nose normal.  Mouth/Throat: Oropharynx is clear and moist. No oropharyngeal exudate.   TMs WNL bilaterally  Eyes: Conjunctivae and EOM are normal. Pupils are equal, round, and reactive to light.  Neck: Normal range of motion. No thyromegaly present.  Cardiovascular: Normal rate, regular rhythm, normal heart sounds and intact distal pulses.   Pulmonary/Chest: Effort normal. No respiratory distress. He has no wheezes. He has no rales.  Slightly diminished breath sounds bilaterally  Abdominal: Soft. Bowel sounds are normal. He exhibits no distension and no mass. There is no tenderness. There is no rebound and no guarding.  Lymphadenopathy:    He has no cervical adenopathy.  Neurological: He is alert and oriented to person, place, and time. He has normal reflexes. No cranial nerve deficit.  Skin: Skin is warm and dry. No rash noted.  Presence of a 1.5 cm lipoma on lower back.     Assessment/Plan: BPH (benign prostatic hyperplasia) Rx for Terazosin refill.  If symptoms persist or worsen, may increase dosage or give Urology referral.  Preventive measure Obtain labs -- Hepatic Function Panel, Lipid Profile, TSH, A1C, etc.  Will call patient with results.  BMP, CBC obtained every 3 weeks from Oncology.  Lipoma of back Benign growth.  Unchanging at present.  Monitor for growth or change in mass.

## 2013-05-28 NOTE — Assessment & Plan Note (Signed)
Rx for Terazosin refill.  If symptoms persist or worsen, may increase dosage or give Urology referral.

## 2013-05-28 NOTE — Addendum Note (Signed)
Addended by: Marcelline Mates on: 05/28/2013 04:22 PM   Modules accepted: Level of Service

## 2013-05-28 NOTE — Assessment & Plan Note (Signed)
Benign growth.  Unchanging at present.  Monitor for growth or change in mass.

## 2013-05-28 NOTE — Assessment & Plan Note (Signed)
Obtain labs -- Hepatic Function Panel, Lipid Profile, TSH, A1C, etc.  Will call patient with results.  BMP, CBC obtained every 3 weeks from Oncology.

## 2013-05-29 ENCOUNTER — Telehealth: Payer: Self-pay | Admitting: *Deleted

## 2013-05-29 LAB — PSA: PSA: 6.36 ng/mL — ABNORMAL HIGH (ref <=4.00)

## 2013-05-29 MED ORDER — TERAZOSIN HCL 2 MG PO CAPS: 2.0000 mg | ORAL_CAPSULE | Freq: Every day | ORAL | Status: DC

## 2013-05-29 NOTE — Telephone Encounter (Signed)
Message copied by Regis Bill on Tue May 29, 2013 11:19 AM ------      Message from: Marcelline Mates      Created: Tue May 29, 2013  8:41 AM       Please late patient's daughter know that overall the patient's lab work is good.  Patient does not speak english well.  PSA is elevated, most likely due to history of enlarged prostate.  With his current diagnosis of Stage IV lung cancer it is unreasonable to do  further workup for elevated levels at this time.  I would like for him to take 2 tablets of his terazosin at bedtime (2 mg), instead of 1 tablet (1 mg) to see if this helps with his urinary flow.  Side effects can include lightheadedness or dizziness, especially with first increase in dose so he should always take this medication at bedtime.  I want to see him in 1 month to reassess his urinary symptoms. ------

## 2013-05-29 NOTE — Addendum Note (Signed)
Addended by: Marcelline Mates on: 05/29/2013 09:08 AM   Modules accepted: Orders

## 2013-06-05 ENCOUNTER — Ambulatory Visit (HOSPITAL_BASED_OUTPATIENT_CLINIC_OR_DEPARTMENT_OTHER): Payer: BC Managed Care – PPO | Admitting: Physician Assistant

## 2013-06-05 ENCOUNTER — Other Ambulatory Visit: Payer: BC Managed Care – PPO | Admitting: Lab

## 2013-06-05 ENCOUNTER — Telehealth: Payer: Self-pay | Admitting: Internal Medicine

## 2013-06-05 ENCOUNTER — Other Ambulatory Visit (HOSPITAL_BASED_OUTPATIENT_CLINIC_OR_DEPARTMENT_OTHER): Payer: BC Managed Care – PPO | Admitting: Lab

## 2013-06-05 ENCOUNTER — Ambulatory Visit (HOSPITAL_BASED_OUTPATIENT_CLINIC_OR_DEPARTMENT_OTHER): Payer: BC Managed Care – PPO

## 2013-06-05 ENCOUNTER — Ambulatory Visit: Payer: BC Managed Care – PPO | Admitting: Internal Medicine

## 2013-06-05 VITALS — BP 140/83 | HR 69 | Temp 98.1°F | Resp 20 | Ht 65.0 in | Wt 144.8 lb

## 2013-06-05 DIAGNOSIS — C801 Malignant (primary) neoplasm, unspecified: Secondary | ICD-10-CM

## 2013-06-05 DIAGNOSIS — C3430 Malignant neoplasm of lower lobe, unspecified bronchus or lung: Secondary | ICD-10-CM

## 2013-06-05 DIAGNOSIS — Z5111 Encounter for antineoplastic chemotherapy: Secondary | ICD-10-CM

## 2013-06-05 DIAGNOSIS — C3491 Malignant neoplasm of unspecified part of right bronchus or lung: Secondary | ICD-10-CM

## 2013-06-05 DIAGNOSIS — C3490 Malignant neoplasm of unspecified part of unspecified bronchus or lung: Secondary | ICD-10-CM

## 2013-06-05 LAB — COMPREHENSIVE METABOLIC PANEL (CC13)
AST: 37 U/L — ABNORMAL HIGH (ref 5–34)
Alkaline Phosphatase: 101 U/L (ref 40–150)
BUN: 16.4 mg/dL (ref 7.0–26.0)
Calcium: 9.7 mg/dL (ref 8.4–10.4)
Chloride: 110 mEq/L — ABNORMAL HIGH (ref 98–109)
Creatinine: 0.9 mg/dL (ref 0.7–1.3)
Total Bilirubin: 0.54 mg/dL (ref 0.20–1.20)

## 2013-06-05 LAB — CBC WITH DIFFERENTIAL/PLATELET
BASO%: 0.6 % (ref 0.0–2.0)
EOS%: 2.5 % (ref 0.0–7.0)
HCT: 35.6 % — ABNORMAL LOW (ref 38.4–49.9)
LYMPH%: 35.7 % (ref 14.0–49.0)
MCH: 32.8 pg (ref 27.2–33.4)
MCHC: 34.8 g/dL (ref 32.0–36.0)
MCV: 94.2 fL (ref 79.3–98.0)
MONO%: 10.5 % (ref 0.0–14.0)
NEUT%: 50.7 % (ref 39.0–75.0)
Platelets: 331 10*3/uL (ref 140–400)

## 2013-06-05 MED ORDER — SODIUM CHLORIDE 0.9 % IJ SOLN
10.0000 mL | INTRAMUSCULAR | Status: DC | PRN
  Administered 2013-06-05: 10 mL
  Filled 2013-06-05: qty 10

## 2013-06-05 MED ORDER — PEMETREXED DISODIUM CHEMO INJECTION 500 MG
500.0000 mg/m2 | Freq: Once | INTRAVENOUS | Status: AC
  Administered 2013-06-05: 875 mg via INTRAVENOUS
  Filled 2013-06-05: qty 35

## 2013-06-05 MED ORDER — HEPARIN SOD (PORK) LOCK FLUSH 100 UNIT/ML IV SOLN
500.0000 [IU] | Freq: Once | INTRAVENOUS | Status: AC | PRN
  Administered 2013-06-05: 500 [IU]
  Filled 2013-06-05: qty 5

## 2013-06-05 MED ORDER — DEXAMETHASONE SODIUM PHOSPHATE 10 MG/ML IJ SOLN
10.0000 mg | Freq: Once | INTRAMUSCULAR | Status: AC
  Administered 2013-06-05: 10 mg via INTRAVENOUS

## 2013-06-05 MED ORDER — ONDANSETRON 8 MG/50ML IVPB (CHCC)
8.0000 mg | Freq: Once | INTRAVENOUS | Status: AC
  Administered 2013-06-05: 8 mg via INTRAVENOUS

## 2013-06-05 MED ORDER — SODIUM CHLORIDE 0.9 % IV SOLN
Freq: Once | INTRAVENOUS | Status: AC
  Administered 2013-06-05: 13:00:00 via INTRAVENOUS

## 2013-06-05 NOTE — Telephone Encounter (Signed)
Gave pt appt for lab, Md and chemo for September 2014

## 2013-06-05 NOTE — Patient Instructions (Addendum)
[facility] Discharge Instructions for Patients Receiving Chemotherapy  Today you received the following chemotherapy agents Alimta  To help prevent nausea and vomiting after your treatment, we encourage you to take your nausea medication as prescribed.   If you develop nausea and vomiting that is not controlled by your nausea medication, call the clinic.   BELOW ARE SYMPTOMS THAT SHOULD BE REPORTED IMMEDIATELY:  *FEVER GREATER THAN 100.5 F  *CHILLS WITH OR WITHOUT FEVER  NAUSEA AND VOMITING THAT IS NOT CONTROLLED WITH YOUR NAUSEA MEDICATION  *UNUSUAL SHORTNESS OF BREATH  *UNUSUAL BRUISING OR BLEEDING  TENDERNESS IN MOUTH AND THROAT WITH OR WITHOUT PRESENCE OF ULCERS  *URINARY PROBLEMS  *BOWEL PROBLEMS  UNUSUAL RASH Items with * indicate a potential emergency and should be followed up as soon as possible.  Feel free to call the clinic you have any questions or concerns. The clinic phone number is [phone].    

## 2013-06-08 NOTE — Patient Instructions (Addendum)
Followup with Dr. Arbutus Ped in 3 weeks with repeat CT scan of your chest, abdomen and pelvis to reevaluate your disease

## 2013-06-08 NOTE — Progress Notes (Signed)
[facility] [phone]   [phone]  OFFICE PROGRESS NOTE  Piedad Climes, PA-C [address] [place] [address]  DIAGNOSIS: Metastatic non-small cell lung cancer favoring adenocarcinoma with negative EGFR mutation, negative ALK gene translocation and negative ROS1 diagnosed in August of 2013   PRIOR THERAPY: Status post 4 cycles of systemic chemotherapy with carboplatin and Alimta with partial response at [facility]. Last dose was given in November of 2013.   CURRENT THERAPY: Maintenance chemotherapy with Alimta 500 mg/M2 started on 09/26/2012, status post 8 cycles.   DISEASE STAGE: Stage IV  CHEMOTHERAPY INTENT: Palliative  CURRENT # OF CHEMOTHERAPY CYCLES: 9  CURRENT ANTIEMETICS:  Zofran and Compazine  CURRENT SMOKING STATUS: Former smoker, quit 05/11/2012  ORAL CHEMOTHERAPY AND CONSENT: n/a  CURRENT BISPHOSPHONATES USE:  None  PAIN MANAGEMENT: None  NARCOTICS INDUCED CONSTIPATION: None  LIVING WILL AND CODE STATUS:     INTERVAL HISTORY: Warrick Borrayo 67 y.o. male returns to the clinic today for followup visit accompanied by his son and his [place] interpreter. The patient is feeling fine today with no specific complaints except for continued mild fatigue. He also has 1-2 days of nausea after his chemotherapy. He denied having any significant weight loss or night sweats. The patient denied having any chest pain, shortness breath, cough or hemoptysis. He denied having any fever or chills.  He states again that he may be ready for a "break" from chemotherapy after his next restaging CT scan, pending the results of that study.  MEDICAL HISTORY: Past Medical History  Diagnosis Date  . Tobacco use   . Lung mass   . BPH (benign prostatic hyperplasia)     ALLERGIES:  has No Known Allergies.  MEDICATIONS:  Current Outpatient Prescriptions  Medication Sig Dispense Refill  . dexamethasone (DECADRON) 4 MG  tablet TAKE 1 TABLET BY MOUTH TWICE A DAY DAY BEFORE DAY OF AND DAY AFTER THE CHEMO THEREAPY EVERY 3 WEEKS  40 tablet  0  . docusate sodium (COLACE) 100 MG capsule Take 100 mg by mouth 2 (two) times daily.      . folic acid (FOLVITE) 1 MG tablet Take 1 tablet (1 mg total) by mouth daily.  30 tablet  2  . lidocaine-prilocaine (EMLA) cream Apply topically as needed. Cover with plastic wrap 1.5 hours before chemo.  30 g  1  . Multiple Vitamin (MULTIVITAMIN) tablet Take 1 tablet by mouth daily.      . prochlorperazine (COMPAZINE) 10 MG tablet Take 5 mg by mouth every 6 (six) hours as needed. Take 0.5 tablets (5 mg total) by mouth every 6 (six) hours as needed for Nausea.      . Saw Palmetto, Serenoa repens, (SAW PALMETTO PO) Take 1 capsule by mouth daily.      Marland Kitchen terazosin (HYTRIN) 2 MG capsule Take 1 capsule (2 mg total) by mouth at bedtime.  30 capsule  2   No current facility-administered medications for this visit.    SURGICAL HISTORY:  Past Surgical History  Procedure Laterality Date  . Video bronchoscopy  05/31/2012    Procedure: VIDEO BRONCHOSCOPY WITH FLUORO;  Surgeon: Nyoka Cowden, MD;  Location: [facility];  Service: Endoscopy;  Laterality: Bilateral;    REVIEW OF SYSTEMS:  Pertinent items are noted in HPI.   PHYSICAL EXAMINATION: General appearance: alert, cooperative and no distress Head: Normocephalic, without obvious abnormality, atraumatic Neck: no adenopathy Lymph nodes: Cervical, supraclavicular, and axillary  nodes normal. Resp: clear to auscultation bilaterally Cardio: regular rate and rhythm, S1, S2 normal, no murmur, click, rub or gallop GI: soft, non-tender; bowel sounds normal; no masses,  no organomegaly Extremities: extremities normal, atraumatic, no cyanosis or edema Neurologic: Alert and oriented X 3, normal strength and tone. Normal symmetric reflexes. Normal coordination and gait   ECOG PERFORMANCE STATUS: 1 - Symptomatic but completely ambulatory  Blood  pressure 140/83, pulse 69, temperature 98.1 F (36.7 C), temperature source Oral, resp. rate 20, height 5\' 5"  (1.651 m), weight 144 lb 12.8 oz (65.681 kg).  LABORATORY DATA: Lab Results  Component Value Date   WBC 6.5 06/05/2013   HGB 12.4* 06/05/2013   HCT 35.6* 06/05/2013   MCV 94.2 06/05/2013   PLT 331 06/05/2013      Chemistry      Component Value Date/Time   NA 141 06/05/2013 1117   NA 141 06/02/2012 1611   K 4.2 06/05/2013 1117   K 3.9 06/02/2012 1611   CL 109* 04/03/2013 1155   CL 108 06/02/2012 1611   CO2 19* 06/05/2013 1117   CO2 24 06/02/2012 1611   BUN 16.4 06/05/2013 1117   BUN 13 06/02/2012 1611   CREATININE 0.9 06/05/2013 1117   CREATININE 0.65 06/02/2012 1611      Component Value Date/Time   CALCIUM 9.7 06/05/2013 1117   CALCIUM 9.6 06/02/2012 1611   ALKPHOS 101 06/05/2013 1117   ALKPHOS 93 05/28/2013 1641   AST 37* 06/05/2013 1117   AST 27 05/28/2013 1641   ALT 34 06/05/2013 1117   ALT 42 05/28/2013 1641   BILITOT 0.54 06/05/2013 1117   BILITOT 0.4 05/28/2013 1641       RADIOGRAPHIC STUDIES: Ct Chest W Contrast  04/23/2013   *RADIOLOGY REPORT*  Clinical Data:  Restaging lung cancer  CT CHEST, ABDOMEN AND PELVIS WITH CONTRAST  Technique:  Multidetector CT imaging of the chest, abdomen and pelvis was performed following the standard protocol during bolus administration of intravenous contrast.  Contrast: OMNIPAQUE IOHEXOL 300 MG/ML  SOLN  Comparison:  02/16/2013 and 12/18/2012.    CT CHEST  Findings:  There is no pleural effusion identified.  The right middle lobe pulmonary lesion measures 1.8 x 2.1 cm, image 32/series 4.  This is compared with 1.6 x 2.3 cm. The smaller satellite the nodule in the right middle lobe measures 8 mm, image 30/series 4. This is compared with 4 mm previously.  Right upper lobe nodule measures 5 mm, image 19/series 4.  This is unchanged from previous exam.  The superior segment of the right lower lobe nodule measures 6 mm, image 21/series 4.  This  is compared with 5 mm previously.  The heart size appears normal.  There is no pericardial effusion. Extensive calcification involving the LAD and left circumflex coronary artery noted.  No axillary or supraclavicular adenopathy noted.  Review of the visualized osseous structures is significant for mild thoracic spondylosis.  No aggressive lytic or sclerotic bone lesions noted.  IMPRESSION:  1.  Primary right middle lobe lesion is stable compared with previous exam. 2.  The small satellite nodule within the right middle lobe increased in size compared with the previous examination.  This now measures 8 mm versus 4 mm previously. 3.  Other small nodules are unchanged. 4.  Atherosclerosis noted.    CT ABDOMEN AND PELVIS  Findings:  There is a stable low attenuation structure within the right hepatic lobe which likely represents a cyst.  This measures  1 cm, image 43/series 2.  Mild intrahepatic biliary dilatation appears similar to previous exam.  The gallbladder appears normal. The common bile duct is normal caliber.  Normal appearance of the pancreas.  The spleen is unremarkable.  The adrenal glands are both normal.  Stone within the upper pole the left kidney measures 4 mm, image 58/series 2.  This is stable from previous exam.  Right kidney appears normal.  There is a multiple stones identified within the lumen of the bladder.  There is a diverticula arising from the left posterior bladder base. This contains a 1.9 cm stone.  The prostate gland enlargement is again noted with mass effect upon the bladder base.  Calcified atherosclerotic disease affects the abdominal aorta.  No aneurysm.  There is no upper abdominal adenopathy.  There is no pelvic or inguinal adenopathy.  The stomach is normal.  The small bowel loops are unremarkable. Normal appearance of the appendix.  The colon is unremarkable. There are no free fluid or abnormal fluid collections within the abdomen or pelvis.  Review of the visualized osseous  structures is significant for mild lumbar spondylosis.  IMPRESSION:  1.  No acute findings within the abdomen or pelvis. 2.  Bladder calculi and bladder diverticula. 3.  Prostate gland enlargement.   Original Report Authenticated By: Signa Kell, M.D.   ASSESSMENT AND PLAN: This is a very pleasant 67 years old Asian male with metastatic non-small cell lung cancer currently undergoing maintenance chemotherapy with single agent Alimta status post 8 cycles. The patient is tolerating his treatment fairly well with no significant evidence for disease progression except for questionable increase in right middle lobe nodule. The patient was discussed with Dr. Arbutus Ped. He will proceed with cycle #9 today as scheduled. He followup with Dr. Arbutus Ped in 3 weeks with a repeat CBC Differential and CMET, as well as a CT of the chest, abdomen and pelvis with contrast to reevaluate his disease.    Krystalynn Ridgeway E, PA-C   He was advised to call immediately if he has any concerning symptoms in the interval.  All questions were answered. The patient knows to call the clinic with any problems, questions or concerns. We can certainly see the patient much sooner if necessary.

## 2013-06-18 ENCOUNTER — Emergency Department (HOSPITAL_BASED_OUTPATIENT_CLINIC_OR_DEPARTMENT_OTHER): Payer: BC Managed Care – PPO

## 2013-06-18 ENCOUNTER — Telehealth: Payer: Self-pay | Admitting: Physician Assistant

## 2013-06-18 ENCOUNTER — Emergency Department (HOSPITAL_BASED_OUTPATIENT_CLINIC_OR_DEPARTMENT_OTHER)
Admission: EM | Admit: 2013-06-18 | Discharge: 2013-06-18 | Disposition: A | Discharge status: Home / Self Care | Payer: BC Managed Care – PPO | Attending: Emergency Medicine | Admitting: Emergency Medicine

## 2013-06-18 ENCOUNTER — Encounter (HOSPITAL_BASED_OUTPATIENT_CLINIC_OR_DEPARTMENT_OTHER): Payer: Self-pay

## 2013-06-18 DIAGNOSIS — IMO0002 Reserved for concepts with insufficient information to code with codable children: Secondary | ICD-10-CM | POA: Insufficient documentation

## 2013-06-18 DIAGNOSIS — M549 Dorsalgia, unspecified: Secondary | ICD-10-CM

## 2013-06-18 DIAGNOSIS — C7800 Secondary malignant neoplasm of unspecified lung: Secondary | ICD-10-CM | POA: Insufficient documentation

## 2013-06-18 DIAGNOSIS — Z87891 Personal history of nicotine dependence: Secondary | ICD-10-CM | POA: Insufficient documentation

## 2013-06-18 DIAGNOSIS — N400 Enlarged prostate without lower urinary tract symptoms: Secondary | ICD-10-CM | POA: Insufficient documentation

## 2013-06-18 DIAGNOSIS — Z79899 Other long term (current) drug therapy: Secondary | ICD-10-CM | POA: Insufficient documentation

## 2013-06-18 DIAGNOSIS — Z859 Personal history of malignant neoplasm, unspecified: Secondary | ICD-10-CM | POA: Insufficient documentation

## 2013-06-18 DIAGNOSIS — M8448XA Pathological fracture, other site, initial encounter for fracture: Secondary | ICD-10-CM | POA: Insufficient documentation

## 2013-06-18 DIAGNOSIS — C801 Malignant (primary) neoplasm, unspecified: Secondary | ICD-10-CM

## 2013-06-18 DIAGNOSIS — M4856XA Collapsed vertebra, not elsewhere classified, lumbar region, initial encounter for fracture: Secondary | ICD-10-CM

## 2013-06-18 HISTORY — DX: Malignant neoplasm of unspecified part of unspecified bronchus or lung: C34.90

## 2013-06-18 LAB — URINALYSIS, ROUTINE W REFLEX MICROSCOPIC
Bilirubin Urine: NEGATIVE
Ketones, ur: NEGATIVE mg/dL
Nitrite: NEGATIVE
pH: 6.5 (ref 5.0–8.0)

## 2013-06-18 LAB — CBC WITH DIFFERENTIAL/PLATELET
Basophils Relative: 0 % (ref 0–1)
Eosinophils Relative: 0 % (ref 0–5)
HCT: 39.1 % (ref 39.0–52.0)
Hemoglobin: 13.4 g/dL (ref 13.0–17.0)
Lymphocytes Relative: 11 % — ABNORMAL LOW (ref 12–46)
MCH: 32.8 pg (ref 26.0–34.0)
MCHC: 34.3 g/dL (ref 30.0–36.0)
Neutro Abs: 12.3 10*3/uL — ABNORMAL HIGH (ref 1.7–7.7)
Neutrophils Relative %: 82 % — ABNORMAL HIGH (ref 43–77)
RBC: 4.09 MIL/uL — ABNORMAL LOW (ref 4.22–5.81)

## 2013-06-18 LAB — ACETAMINOPHEN LEVEL: Acetaminophen (Tylenol), Serum: <15 ug/mL (ref 10–30)

## 2013-06-18 LAB — COMPREHENSIVE METABOLIC PANEL
Albumin: 3.5 g/dL (ref 3.5–5.2)
Alkaline Phosphatase: 110 U/L (ref 39–117)
BUN: 12 mg/dL (ref 6–23)
Chloride: 100 mEq/L (ref 96–112)
Glucose, Bld: 130 mg/dL — ABNORMAL HIGH (ref 70–99)
Potassium: 3.6 mEq/L (ref 3.5–5.1)
Total Bilirubin: 0.6 mg/dL (ref 0.3–1.2)

## 2013-06-18 LAB — URINE MICROSCOPIC-ADD ON

## 2013-06-18 MED ORDER — HEPARIN SOD (PORK) LOCK FLUSH 100 UNIT/ML IV SOLN
500.0000 [IU] | Freq: Once | INTRAVENOUS | Status: AC
  Administered 2013-06-18: 500 [IU]

## 2013-06-18 MED ORDER — HEPARIN SOD (PORK) LOCK FLUSH 100 UNIT/ML IV SOLN
INTRAVENOUS | Status: AC
  Filled 2013-06-18: qty 5

## 2013-06-18 MED ORDER — OXYCODONE-ACETAMINOPHEN 5-325 MG PO TABS: 1.0000 | ORAL_TABLET | Freq: Four times a day (QID) | ORAL | Status: DC | PRN

## 2013-06-18 MED ORDER — HYDROMORPHONE HCL PF 2 MG/ML IJ SOLN
2.0000 mg | Freq: Once | INTRAMUSCULAR | Status: AC
  Administered 2013-06-18: 2 mg via INTRAMUSCULAR
  Filled 2013-06-18: qty 1

## 2013-06-18 NOTE — ED Notes (Signed)
PA at bedside.

## 2013-06-18 NOTE — ED Notes (Signed)
Patient transported to CT via stretcher per tech. 

## 2013-06-18 NOTE — ED Notes (Signed)
Pt asking about back brace as pa has spoken with them about same.  PA notified.  Pt and family informed pa will speak with them one more time and will then be ok to leave.

## 2013-06-18 NOTE — ED Provider Notes (Signed)
CSN: [id number]     Arrival date & time 06/18/13  1605 History   First MD Initiated Contact with Patient 06/18/13 1629     Chief Complaint  Patient presents with  . Back Pain   (Consider location/radiation/quality/duration/timing/severity/associated sxs/prior Treatment) HPI Comments: Patient with past medical history of metastatic lung cancer, presents emergency department with chief complaint of right-sided low back pain. He states he has had back pain times one week. He states that the pain is uncontrolled with pain medicine, Tylenol, and alcohol. He denies fevers chills. States he is able to ambulate, but that it is very painful. States that he's been getting acupuncture for his back pain with some relief. The pain is worsened when his right leg is extended. He denies new bowel or bladder incontinence, or saddle anesthesia.  The history is provided by the patient. The history is limited by a language barrier. A language interpreter was used.    Past Medical History  Diagnosis Date  . Tobacco use   . Lung mass   . BPH (benign prostatic hyperplasia)   . Lung cancer    Past Surgical History  Procedure Laterality Date  . Video bronchoscopy  05/31/2012    Procedure: VIDEO BRONCHOSCOPY WITH FLUORO;  Surgeon: Nyoka Cowden, MD;  Location: [facility];  Service: Endoscopy;  Laterality: Bilateral;   Family History  Problem Relation Age of Onset  . Ulcers Other   . Coronary artery disease Neg Hx    History  Substance Use Topics  . Smoking status: Former Smoker -- 0.50 packs/day for 40 years    Types: Cigarettes    Quit date: 05/11/2012  . Smokeless tobacco: Never Used     Comment: Patient is using Nicotine patches.  . Alcohol Use: 1.5 oz/week    3 drink(s) per week     Comment: three times weekly    Review of Systems  All other systems reviewed and are negative.    Allergies  Review of patient's allergies indicates no known allergies.  Home Medications   Current  Outpatient Rx  Name  Route  Sig  Dispense  Refill  . dexamethasone (DECADRON) 4 MG tablet      TAKE 1 TABLET BY MOUTH TWICE A DAY DAY BEFORE DAY OF AND DAY AFTER THE CHEMO THEREAPY EVERY 3 WEEKS   40 tablet   0   . docusate sodium (COLACE) 100 MG capsule   Oral   Take 100 mg by mouth 2 (two) times daily.         . folic acid (FOLVITE) 1 MG tablet   Oral   Take 1 tablet (1 mg total) by mouth daily.   30 tablet   2   . lidocaine-prilocaine (EMLA) cream   Topical   Apply topically as needed. Cover with plastic wrap 1.5 hours before chemo.   30 g   1   . Multiple Vitamin (MULTIVITAMIN) tablet   Oral   Take 1 tablet by mouth daily.         . prochlorperazine (COMPAZINE) 10 MG tablet   Oral   Take 5 mg by mouth every 6 (six) hours as needed. Take 0.5 tablets (5 mg total) by mouth every 6 (six) hours as needed for Nausea.         . Saw Palmetto, Serenoa repens, (SAW PALMETTO PO)   Oral   Take 1 capsule by mouth daily.         Marland Kitchen terazosin (HYTRIN) 2 MG capsule  Oral   Take 1 capsule (2 mg total) by mouth at bedtime.   30 capsule   2    BP 169/91  Pulse 90  Temp(Src) 99.3 F (37.4 C) (Oral)  Resp 18  Ht 5\' 6"  (1.676 m)  Wt 143 lb (64.864 kg)  BMI 23.09 kg/m2  SpO2 100% Physical Exam  Nursing note and vitals reviewed. Constitutional: He is oriented to person, place, and time. He appears well-developed and well-nourished. No distress.  HENT:  Head: Normocephalic and atraumatic.  Eyes: Conjunctivae and EOM are normal. Right eye exhibits no discharge. Left eye exhibits no discharge. No scleral icterus.  Neck: Normal range of motion. Neck supple. No tracheal deviation present.  Cardiovascular: Normal rate, regular rhythm and normal heart sounds.  Exam reveals no gallop and no friction rub.   No murmur heard. Pulmonary/Chest: Effort normal and breath sounds normal. No respiratory distress. He has no wheezes.  Abdominal: Soft. He exhibits no distension. There is  no tenderness.  Musculoskeletal: Normal range of motion.  Right-sided lumbar paraspinal muscles tender to palpation, no bony tenderness, step-offs, or gross abnormality or deformity of spine, patient is able to ambulate, moves all extremities  Bilateral great toe extension intact Bilateral plantar/dorsiflexion intact  Neurological: He is alert and oriented to person, place, and time. He has normal reflexes.  Sensation and strength intact bilaterally Symmetrical reflexes  Skin: Skin is warm. He is not diaphoretic.  Psychiatric: He has a normal mood and affect. His behavior is normal. Judgment and thought content normal.    ED Course  Procedures (including critical care time) Labs Review Labs Reviewed  CBC WITH DIFFERENTIAL  COMPREHENSIVE METABOLIC PANEL  ACETAMINOPHEN LEVEL   Imaging Review Ct Abdomen Pelvis Wo Contrast  06/18/2013   *RADIOLOGY REPORT*  Clinical Data: Back pain.  Metastatic lung cancer.  CT ABDOMEN AND PELVIS WITHOUT CONTRAST  Technique:  Multidetector CT imaging of the abdomen and pelvis was performed following the standard protocol without intravenous contrast.  Comparison: CT of the abdomen and pelvis 04/23/2013.  Findings:  Lung Bases: Incompletely visualized right middle lobe mass measuring at least 3.2 x 2.0 cm appears increased in size compared to the prior study 04/23/2013.  A trace amount of pleural thickening and/or fluid bilaterally.  The tip of a central venous catheter in the superior aspect of the right atrium.  A small hiatal hernia.  Abdomen/Pelvis:  5 mm nonobstructive calculus in the upper pole collecting system of the left kidney.  No additional calculi within the right renal collecting system and or along the course of either ureter.  No hydroureteronephrosis or perinephric stranding to suggest urinary tract obstruction at this time.  Several bladder calculi are noted, and there is a diverticulum extending off the posterolateral aspect of the urinary bladder  on the left side which also contains calculi.  The largest of these calculi is within the left diverticulum measuring 21 mm in length.  The unenhanced appearance of the liver, gallbladder, pancreas, spleen and bilateral adrenal glands is unremarkable.  There is atherosclerosis throughout the abdominal and pelvic vasculature, without evidence of aneurysm.  No significant volume of ascites. No pneumoperitoneum.  No pathologic distension of small bowel.  No definite pathologic lymphadenopathy identified within the abdomen or pelvis on today's noncontrast CT examination.  Normal appendix.  Musculoskeletal: Compared to the prior study there has been interval enlargement of numerous predominately lytic aggressive appearing lesions throughout the visualized axial and appendicular skeleton, compatible with progression of metastatic disease to the  bones.  Several of these lesions in the spine appear to be associated with some pathologic compression, most notably involving the superior endplate of T12 posteriorly, the posterior aspect of L4 on the right side, where there is a small amount of a the soft tissue impinging upon the underlying spinal canal, and the posterolateral aspect of L5.  IMPRESSION: 1.  Progression of metastatic disease to the lungs and bones with interval development of pathologic compression fractures at T12, L4- L5, as above. 2.  5 mm nonobstructive calculus in the left renal collecting system and multiple bladder calculi redemonstrated, as above. 3.  Extensive atherosclerosis. 4.  Additional incidental findings, as above.   Original Report Authenticated By: Trudie Reed, M.D.   Dg Lumbar Spine Complete  06/18/2013   *RADIOLOGY REPORT*  Clinical Data: Pain.  LUMBAR SPINE - COMPLETE 4+ VIEW  Comparison: 04/23/2013  Findings: The bones are diffusely osteopenic.  Normal alignment of the lumbar spine.  The vertebral body heights are well preserved. There is multilevel disc space narrowing and ventral  endplate spurring consistent with spondylosis.  Facet hypertrophy and degenerative changes also noted.  There is a calcified atherosclerotic disease affecting the abdominal aorta. Stone within the upper pole of the left kidney measures 3 mm.  Bladder calculi are again identified including stones within the bladder diverticula.  IMPRESSION:  1.  Osteopenia and mild spondylosis.   Original Report Authenticated By: Signa Kell, M.D.   Dg Hip Complete Right  06/18/2013   *RADIOLOGY REPORT*  Clinical Data: Back pain.  RIGHT HIP - COMPLETE 2+ VIEW  Comparison: None  Findings: There is no evidence of fracture or dislocation.  There is no evidence of arthropathy or other focal bone abnormality. Soft tissues are unremarkable.  IMPRESSION: Negative exam.   Original Report Authenticated By: Signa Kell, M.D.    MDM   1. Compression fracture of lumbar spine, non-traumatic, initial encounter   2. Cancer   3. Back pain    Patient with back pain.  Patient has history of metastatic cancer.  Will order plain films and basic labs.  Doubt kidney stone/UTI, not colicky in nature.  Worse with palpation and movement.  Suspect musculoskeletal. Patient discussed with Dr. Jeraldine Loots, who agrees with the plan.  Patient with new compression fractures of the lumbar spine. These are believed to be pathologic secondary to his cancer metastases. Will treat with pain medicine, and recommend primary care/oncology followup. Discussed the CT findings with Dr. Jeraldine Loots, who agrees that pain management is appropriate treatment. No bracing. Patient encouraged to followup with his PCP. Patient states his pain is largely improved today with IM dilaudid. Patient is stable and ready for discharge.   Roxy Horseman, PA-C 06/18/13 2000  Roxy Horseman, PA-C 06/18/13 2001

## 2013-06-18 NOTE — ED Notes (Signed)
Patient transported to X-ray via stretcher per tech. 

## 2013-06-18 NOTE — ED Notes (Signed)
Urinal emptied

## 2013-06-18 NOTE — ED Provider Notes (Signed)
  Medical screening examination/treatment/procedure(s) were performed by non-physician practitioner and as supervising physician I was immediately available for consultation/collaboration.    Jowan Skillin, MD 06/18/13 2313 

## 2013-06-18 NOTE — ED Notes (Addendum)
Per daughter/interpreting-pt c/o lower back pain, right leg x 1 week after chemotherapy-pt using acupunture w/o relief-lung CA stage 4 with mets to spine, ribs and lymph nodes

## 2013-06-18 NOTE — Telephone Encounter (Signed)
Patient was with his daughter when she came for an appointment.  He is suffering back pain.  Selena Batten advised if the pain is severe the patient should go to the ED.  Advised daughter of this information.  She expressed understanding.

## 2013-06-19 ENCOUNTER — Telehealth: Payer: Self-pay | Admitting: Medical Oncology

## 2013-06-19 NOTE — Telephone Encounter (Signed)
I left message with Thomas May for her dad to keep CT chest appt this week and that he will not get CT a/p , I called and cancelled them with radiology.

## 2013-06-19 NOTE — Telephone Encounter (Signed)
Daughter asking if pt still needs CT chest . Had CT a/p 9/8 which showed:  "Lung Bases: Incompletely visualized right middle lobe mass  measuring at least 3.2 x 2.0 cm appears increased in size compared  to the prior study 04/23/2013. A trace amount of pleural  thickening and/or fluid bilaterally. The tip of a central venous  catheter in the superior aspect of the right atrium. A small  hiatal hernia."

## 2013-06-21 ENCOUNTER — Telehealth: Payer: Self-pay

## 2013-06-21 DIAGNOSIS — M8430XS Stress fracture, unspecified site, sequela: Secondary | ICD-10-CM

## 2013-06-21 MED ORDER — OXYCODONE-ACETAMINOPHEN 5-325 MG PO TABS: 1.0000 | ORAL_TABLET | Freq: Four times a day (QID) | ORAL | Status: DC | PRN

## 2013-06-21 NOTE — Telephone Encounter (Signed)
Patient's daughter informed, understood & agreed/SLS 

## 2013-06-21 NOTE — Telephone Encounter (Signed)
Pt seen in ED 09.08.14 for Spinal Compression Fx; Please Advise/SLS

## 2013-06-21 NOTE — Telephone Encounter (Signed)
Refill percocet 25 tablets.  Follow-up at scheduled appointment or sooner if needed.  Additional pain meds would be best handled by oncology.

## 2013-06-21 NOTE — Telephone Encounter (Signed)
pts daughter left a message stating that her dad was at the ED and got Hydrocodone. Pts daughter states that he needs a refill on this. Please advise?

## 2013-06-22 ENCOUNTER — Telehealth: Payer: Self-pay | Admitting: Medical Oncology

## 2013-06-22 ENCOUNTER — Other Ambulatory Visit: Payer: Self-pay | Admitting: Medical Oncology

## 2013-06-22 ENCOUNTER — Other Ambulatory Visit: Payer: Self-pay | Admitting: Physician Assistant

## 2013-06-22 ENCOUNTER — Ambulatory Visit (HOSPITAL_COMMUNITY)
Admission: RE | Admit: 2013-06-22 | Discharge: 2013-06-22 | Disposition: A | Discharge status: Home / Self Care | Payer: BC Managed Care – PPO | Source: Ambulatory Visit | Attending: Physician Assistant | Admitting: Physician Assistant

## 2013-06-22 ENCOUNTER — Encounter (HOSPITAL_COMMUNITY): Payer: Self-pay

## 2013-06-22 DIAGNOSIS — I2510 Atherosclerotic heart disease of native coronary artery without angina pectoris: Secondary | ICD-10-CM | POA: Insufficient documentation

## 2013-06-22 DIAGNOSIS — K838 Other specified diseases of biliary tract: Secondary | ICD-10-CM | POA: Insufficient documentation

## 2013-06-22 DIAGNOSIS — I517 Cardiomegaly: Secondary | ICD-10-CM | POA: Insufficient documentation

## 2013-06-22 DIAGNOSIS — I700 Atherosclerosis of aorta: Secondary | ICD-10-CM | POA: Insufficient documentation

## 2013-06-22 DIAGNOSIS — C3490 Malignant neoplasm of unspecified part of unspecified bronchus or lung: Secondary | ICD-10-CM | POA: Insufficient documentation

## 2013-06-22 DIAGNOSIS — M899 Disorder of bone, unspecified: Secondary | ICD-10-CM | POA: Insufficient documentation

## 2013-06-22 MED ORDER — OXYCODONE HCL 10 MG PO TB12: 10.0000 mg | ORAL_TABLET | Freq: Two times a day (BID) | ORAL | Status: DC

## 2013-06-22 MED ORDER — IOHEXOL 300 MG/ML  SOLN
80.0000 mL | Freq: Once | INTRAMUSCULAR | Status: AC | PRN
  Administered 2013-06-22: 80 mL via INTRAVENOUS

## 2013-06-22 MED ORDER — IOHEXOL 300 MG/ML  SOLN: 50.0000 mL | Freq: Once | INTRAMUSCULAR | Status: DC | PRN

## 2013-06-22 NOTE — Telephone Encounter (Signed)
Spoke to his daughter and she reports he is having "severe, unbearable pain  In his back". He is currently on percocet 5-325 mg, 2 tablets, every 6 hours as needed . It is not holding his pain. Per [facility] pt given rx for oxycontin 10 every 12 hours .

## 2013-06-26 ENCOUNTER — Other Ambulatory Visit (HOSPITAL_BASED_OUTPATIENT_CLINIC_OR_DEPARTMENT_OTHER): Payer: BC Managed Care – PPO | Admitting: Lab

## 2013-06-26 ENCOUNTER — Ambulatory Visit: Payer: BC Managed Care – PPO

## 2013-06-26 ENCOUNTER — Encounter: Payer: Self-pay | Admitting: Physician Assistant

## 2013-06-26 ENCOUNTER — Ambulatory Visit (HOSPITAL_BASED_OUTPATIENT_CLINIC_OR_DEPARTMENT_OTHER): Payer: BC Managed Care – PPO | Admitting: Physician Assistant

## 2013-06-26 ENCOUNTER — Telehealth: Payer: Self-pay | Admitting: Internal Medicine

## 2013-06-26 VITALS — BP 143/89 | HR 79 | Temp 97.2°F | Resp 18 | Ht 66.0 in | Wt 138.3 lb

## 2013-06-26 DIAGNOSIS — C3490 Malignant neoplasm of unspecified part of unspecified bronchus or lung: Secondary | ICD-10-CM

## 2013-06-26 DIAGNOSIS — C801 Malignant (primary) neoplasm, unspecified: Secondary | ICD-10-CM

## 2013-06-26 LAB — COMPREHENSIVE METABOLIC PANEL (CC13)
Albumin: 3.3 g/dL — ABNORMAL LOW (ref 3.5–5.0)
BUN: 18.3 mg/dL (ref 7.0–26.0)
CO2: 26 mEq/L (ref 22–29)
Calcium: 10.1 mg/dL (ref 8.4–10.4)
Chloride: 103 mEq/L (ref 98–109)
Creatinine: 1 mg/dL (ref 0.7–1.3)
Glucose: 114 mg/dl (ref 70–140)

## 2013-06-26 LAB — CBC WITH DIFFERENTIAL/PLATELET
Basophils Absolute: 0.1 10*3/uL (ref 0.0–0.1)
Eosinophils Absolute: 0.2 10*3/uL (ref 0.0–0.5)
HCT: 38.9 % (ref 38.4–49.9)
HGB: 13.5 g/dL (ref 13.0–17.1)
MCH: 32.4 pg (ref 27.2–33.4)
MONO#: 0.8 10*3/uL (ref 0.1–0.9)
NEUT#: 6.8 10*3/uL — ABNORMAL HIGH (ref 1.5–6.5)
NEUT%: 69.7 % (ref 39.0–75.0)
WBC: 9.8 10*3/uL (ref 4.0–10.3)
lymph#: 2 10*3/uL (ref 0.9–3.3)

## 2013-06-26 NOTE — Telephone Encounter (Signed)
, °

## 2013-06-26 NOTE — Progress Notes (Addendum)
[facility] [phone]   [phone]  OFFICE PROGRESS NOTE  Piedad Climes, PA-C [address] [place] [address]  DIAGNOSIS: Metastatic non-small cell lung cancer favoring adenocarcinoma with negative EGFR mutation, negative ALK gene translocation and negative ROS1 diagnosed in August of 2013   PRIOR THERAPY: Status post 4 cycles of systemic chemotherapy with carboplatin and Alimta with partial response at [facility]. Last dose was given in November of 2013.   CURRENT THERAPY: Maintenance chemotherapy with Alimta 500 mg/M2 started on 09/26/2012, status post 9 cycles.   DISEASE STAGE: Stage IV  CHEMOTHERAPY INTENT: Palliative  CURRENT # OF CHEMOTHERAPY CYCLES: 9  CURRENT ANTIEMETICS:  Zofran and Compazine  CURRENT SMOKING STATUS: Former smoker, quit 05/11/2012  ORAL CHEMOTHERAPY AND CONSENT: n/a  CURRENT BISPHOSPHONATES USE:  None  PAIN MANAGEMENT: None  NARCOTICS INDUCED CONSTIPATION: None  LIVING WILL AND CODE STATUS:     INTERVAL HISTORY: Thomas May 67 y.o. male returns to the clinic today for followup visit accompanied by his son, daughter and his [place] interpreter. Patient complains of right hip pain. He states that it started approximately 3 weeks ago. He went to the emergency room last week. States pain has not been helped with acupuncture. He is currently taking Percocet one tablet every 6 hours and was started on OxyContin 10 mg every 12 hours by me last Friday. He is status post 9 cycles of maintenance chemotherapy with single agent Alimta and recently had a restaging CT scan of the chest, abdomen and pelvis and presents to discuss the results of that study.  He also has 1-2 days of nausea after his chemotherapy. His by mouth intake has been decreased due to the significant amount of pain that he has been in.  The patient denied having any chest pain, shortness breath, cough or hemoptysis. He denied  having any fever or chills.  He states again that he may be ready for a "break" from chemotherapy.   MEDICAL HISTORY: Past Medical History  Diagnosis Date  . Tobacco use   . Lung mass   . BPH (benign prostatic hyperplasia)   . Lung cancer     ALLERGIES:  has No Known Allergies.  MEDICATIONS:  Current Outpatient Prescriptions  Medication Sig Dispense Refill  . dexamethasone (DECADRON) 4 MG tablet TAKE 1 TABLET BY MOUTH TWICE A DAY DAY BEFORE DAY OF AND DAY AFTER THE CHEMO THEREAPY EVERY 3 WEEKS  40 tablet  0  . docusate sodium (COLACE) 100 MG capsule Take 100 mg by mouth 2 (two) times daily.      . folic acid (FOLVITE) 1 MG tablet Take 1 tablet (1 mg total) by mouth daily.  30 tablet  2  . lidocaine-prilocaine (EMLA) cream Apply topically as needed. Cover with plastic wrap 1.5 hours before chemo.  30 g  1  . Multiple Vitamin (MULTIVITAMIN) tablet Take 1 tablet by mouth daily.      Marland Kitchen oxyCODONE (OXYCONTIN) 10 MG 12 hr tablet Take 1 tablet (10 mg total) by mouth every 12 (twelve) hours.  60 tablet  0  . oxyCODONE-acetaminophen (PERCOCET/ROXICET) 5-325 MG per tablet Take 1 tablet by mouth every 6 (six) hours as needed for pain. May take 2 tablets PO q 6 hours for severe pain - Do not take with Tylenol as this tablet already contains tylenol  25 tablet  0  . prochlorperazine (COMPAZINE) 10 MG tablet Take 5 mg by mouth every 6 (  six) hours as needed. Take 0.5 tablets (5 mg total) by mouth every 6 (six) hours as needed for Nausea.      . Saw Palmetto, Serenoa repens, (SAW PALMETTO PO) Take 1 capsule by mouth daily.      Marland Kitchen terazosin (HYTRIN) 2 MG capsule Take 1 capsule (2 mg total) by mouth at bedtime.  30 capsule  2   No current facility-administered medications for this visit.    SURGICAL HISTORY:  Past Surgical History  Procedure Laterality Date  . Video bronchoscopy  05/31/2012    Procedure: VIDEO BRONCHOSCOPY WITH FLUORO;  Surgeon: Nyoka Cowden, MD;  Location: [facility];  Service:  Endoscopy;  Laterality: Bilateral;    REVIEW OF SYSTEMS:  Pertinent items are noted in HPI.   PHYSICAL EXAMINATION: General appearance: alert, cooperative and no distress Head: Normocephalic, without obvious abnormality, atraumatic Neck: no adenopathy Lymph nodes: Cervical, supraclavicular, and axillary nodes normal. Resp: clear to auscultation bilaterally Cardio: regular rate and rhythm, S1, S2 normal, no murmur, click, rub or gallop GI: soft, non-tender; bowel sounds normal; no masses,  no organomegaly Extremities: extremities normal, atraumatic, no cyanosis or edema Neurologic: Alert and oriented X 3, normal strength and tone. Normal symmetric reflexes. Normal coordination and gait   ECOG PERFORMANCE STATUS: 1 - Symptomatic but completely ambulatory  Blood pressure 143/89, pulse 79, temperature 97.2 F (36.2 C), temperature source Oral, resp. rate 18, height 5\' 6"  (1.676 m), weight 138 lb 4.8 oz (62.732 kg).  LABORATORY DATA: Lab Results  Component Value Date   WBC 9.8 06/26/2013   HGB 13.5 06/26/2013   HCT 38.9 06/26/2013   MCV 93.3 06/26/2013   PLT 251 06/26/2013      Chemistry      Component Value Date/Time   NA 138 06/26/2013 1115   NA 137 06/18/2013 1712   K 3.8 06/26/2013 1115   K 3.6 06/18/2013 1712   CL 100 06/18/2013 1712   CL 109* 04/03/2013 1155   CO2 26 06/26/2013 1115   CO2 23 06/18/2013 1712   BUN 18.3 06/26/2013 1115   BUN 12 06/18/2013 1712   CREATININE 1.0 06/26/2013 1115   CREATININE 0.80 06/18/2013 1712   CREATININE 0.65 06/02/2012 1611      Component Value Date/Time   CALCIUM 10.1 06/26/2013 1115   CALCIUM 10.0 06/18/2013 1712   ALKPHOS 118 06/26/2013 1115   ALKPHOS 110 06/18/2013 1712   AST 29 06/26/2013 1115   AST 34 06/18/2013 1712   ALT 47 06/26/2013 1115   ALT 38 06/18/2013 1712   BILITOT 0.82 06/26/2013 1115   BILITOT 0.6 06/18/2013 1712       RADIOGRAPHIC STUDIES: Ct Abdomen Pelvis Wo Contrast  06/18/2013   *RADIOLOGY REPORT*  Clinical Data: Back pain.   Metastatic lung cancer.  CT ABDOMEN AND PELVIS WITHOUT CONTRAST  Technique:  Multidetector CT imaging of the abdomen and pelvis was performed following the standard protocol without intravenous contrast.  Comparison: CT of the abdomen and pelvis 04/23/2013.  Findings:  Lung Bases: Incompletely visualized right middle lobe mass measuring at least 3.2 x 2.0 cm appears increased in size compared to the prior study 04/23/2013.  A trace amount of pleural thickening and/or fluid bilaterally.  The tip of a central venous catheter in the superior aspect of the right atrium.  A small hiatal hernia.  Abdomen/Pelvis:  5 mm nonobstructive calculus in the upper pole collecting system of the left kidney.  No additional calculi within the right renal collecting system and  or along the course of either ureter.  No hydroureteronephrosis or perinephric stranding to suggest urinary tract obstruction at this time.  Several bladder calculi are noted, and there is a diverticulum extending off the posterolateral aspect of the urinary bladder on the left side which also contains calculi.  The largest of these calculi is within the left diverticulum measuring 21 mm in length.  The unenhanced appearance of the liver, gallbladder, pancreas, spleen and bilateral adrenal glands is unremarkable.  There is atherosclerosis throughout the abdominal and pelvic vasculature, without evidence of aneurysm.  No significant volume of ascites. No pneumoperitoneum.  No pathologic distension of small bowel.  No definite pathologic lymphadenopathy identified within the abdomen or pelvis on today's noncontrast CT examination.  Normal appendix.  Musculoskeletal: Compared to the prior study there has been interval enlargement of numerous predominately lytic aggressive appearing lesions throughout the visualized axial and appendicular skeleton, compatible with progression of metastatic disease to the bones.  Several of these lesions in the spine appear to be  associated with some pathologic compression, most notably involving the superior endplate of T12 posteriorly, the posterior aspect of L4 on the right side, where there is a small amount of a the soft tissue impinging upon the underlying spinal canal, and the posterolateral aspect of L5.  IMPRESSION: 1.  Progression of metastatic disease to the lungs and bones with interval development of pathologic compression fractures at T12, L4- L5, as above. 2.  5 mm nonobstructive calculus in the left renal collecting system and multiple bladder calculi redemonstrated, as above. 3.  Extensive atherosclerosis. 4.  Additional incidental findings, as above.   Original Report Authenticated By: Trudie Reed, M.D.   Dg Lumbar Spine Complete  06/18/2013   *RADIOLOGY REPORT*  Clinical Data: Pain.  LUMBAR SPINE - COMPLETE 4+ VIEW  Comparison: 04/23/2013  Findings: The bones are diffusely osteopenic.  Normal alignment of the lumbar spine.  The vertebral body heights are well preserved. There is multilevel disc space narrowing and ventral endplate spurring consistent with spondylosis.  Facet hypertrophy and degenerative changes also noted.  There is a calcified atherosclerotic disease affecting the abdominal aorta. Stone within the upper pole of the left kidney measures 3 mm.  Bladder calculi are again identified including stones within the bladder diverticula.  IMPRESSION:  1.  Osteopenia and mild spondylosis.   Original Report Authenticated By: Signa Kell, M.D.   Dg Hip Complete Right  06/18/2013   *RADIOLOGY REPORT*  Clinical Data: Back pain.  RIGHT HIP - COMPLETE 2+ VIEW  Comparison: None  Findings: There is no evidence of fracture or dislocation.  There is no evidence of arthropathy or other focal bone abnormality. Soft tissues are unremarkable.  IMPRESSION: Negative exam.   Original Report Authenticated By: Signa Kell, M.D.   Ct Chest W Contrast  06/22/2013   CLINICAL DATA:  History of lung cancer diagnosed in August  2013. Chemotherapy in progress.  EXAM: CT CHEST WITH CONTRAST  TECHNIQUE: Multidetector CT imaging of the chest was performed during intravenous contrast administration.  CONTRAST:  100 OMNIPAQUE IOHEXOL 300 MG/ML SOLN, 80mL OMNIPAQUE IOHEXOL 300 MG/ML SOLN  COMPARISON:  CT of the chest, abdomen and pelvis 04/23/2013.  FINDINGS: Mediastinum: Heart size is mildly enlarged. There is no significant pericardial fluid, thickening or pericardial calcification. There is atherosclerosis of the thoracic aorta, the great vessels of the mediastinum and the coronary arteries, including calcified atherosclerotic plaque in the left main, left anterior descending and left circumflex coronary arteries. No pathologically enlarged  mediastinal or hilar lymph nodes. Esophagus is unremarkable in appearance. Right internal jugular single-lumen porta cath with tip terminating at the superior cavoatrial junction.  Lungs/Pleura: Compared to the prior study, the previously noted right middle lobe pulmonary nodule has significantly increased in size, currently a 3.3 x 2.0 cm mass with macrolobulated and spiculated margins. Additionally, the previously noted satellite nodules have also increased, largest of which measures 1.1 cm in diameter (image 31 of series 5). Surrounding postobstructive changes are noted in the adjacent lung parenchyma in the right middle lobe. In addition, there is a new peripheral nodular opacity in the lateral aspect of the right lower lobe measuring approximately 1.2 x 1.0 cm (image 22 of series 5). Previously noted 8 mm right upper lobe nodule (image 16 of series 5) and a right upper lobe pulmonary nodule measuring 4 mm (image 13 of series 5) are both unchanged. Small amount of pleural thickening in the thorax bilaterally. No frank pleural effusions.  Upper Abdomen: Mild intrahepatic biliary ductal dilatation. 11 mm low-attenuation lesion in segment 8 of the liver is unchanged. 5 mm nonobstructive calculus in the  upper pole collecting system of the left kidney is unchanged.  Musculoskeletal: In the posterolateral aspect of the left 1st rib and lateral aspect of the left 3rd rib there are lytic lesions which have a "moth-eaten" appearance, compatible with metastatic lesions. In addition, there is a mixed lytic and sclerotic lesion in T12 vertebral body posteriorly which has enlarged compared to the prior examination. Additionally, there is a lytic lesion involving the lamina of T12 on the left.  IMPRESSION: 1. Progression of disease as demonstrated by enlargement of the primary right middle lobe mass, increased size of satellite nodules in the right middle lobe, new suspicious nodule in the right lower lobe, and progression of metastatic disease to the bones, as detailed above. 2. Cardiomegaly with left main and 2 vessel coronary artery disease. 3. Additional incidental findings, as above, similar prior studies.   Electronically Signed   By: Trudie Reed M.D.   On: 06/22/2013 17:01     ASSESSMENT AND PLAN: This is a very pleasant 67 years old Asian male with metastatic non-small cell lung cancer currently undergoing maintenance chemotherapy with single agent Alimta status post 9 cycles. The patient is tolerating his treatment fairly well. Unfortunately his most recent scan revealed evidence for disease progression as demonstrated by the enlargement of the primary right middle lobe mass and increased size of the satellite nodules in the right middle lobe. There is also a new suspicious nodule in the right lower lobe and progressive metastatic disease to the bones. The patient was discussed with an also seen by Dr. Arbutus Ped. He is right hip pain is concerning. We will arrange for the patient to have a stat MRI of the thoracic and lumbar spine as well as the right hip. We will also refer him to radiation oncology as a stat referral for palliative radiotherapy for pain control. Additionally for pain control we will  increase his OxyContin to 20 mg by mouth every 12 hours and he may continue to take Percocet one tablet by mouth every 6 hours as needed for breakthrough pain. We will refer him to Dr. Valentino Hue for evaluation and clearance for [facility] therapy. Additionally we will send a Veristrat test to see the patient would respond to an oral chemotherapy agent such as Tarceva. Should theVeristrat test results come back as good it would indicate that he would respond to Tarceva however should  the results be poor he would indicate that he would not respond to Tarceva. We will have the patient return in 2 weeks to discuss the results of all of the above studies and to discuss further treatment options.   Yesenia Locurto E, PA-C   He was advised to call immediately if he has any concerning symptoms in the interval.  All questions were answered. The patient knows to call the clinic with any problems, questions or concerns. We can certainly see the patient much sooner if necessary.  ADDENDUM:  Hematology/Oncology Attending:  I have a face to face encounter with the patient during his visit. I recommended his care plan. This is a very pleasant 67 years old [place] male with metastatic non-small cell lung cancer, adenocarcinoma with negative EGFR mutation and negative ALK gene translocation diagnosed in August of 2016 status post 4 cycles of induction chemotherapy with carboplatin and Alimta and was on maintenance Alimta for 9 cycles. The recent CT scan of the chest, abdomen and pelvis showed evidence for disease progression in the lung as well as bone. The patient is also complaining of pain in the right hip area and he is currently on pain medications the form of OxyContin 10 mg by mouth every 12 hours in addition to Percocet every 6 hours on as-needed basis. I have a lengthy discussion with the patient and his family today about his current condition. I recommended for the patient the following: 1) MRI of the thoracolumbar  spine as well as the right hip for evaluation of his bone metastasis and to rule out any cord compression. 2) referred the patient to radiation oncology for consideration of palliative radiotherapy to the painful bone lesions. 3) I will order blood tests for Veristrat to evaluate the patient for treatment with Tarceva as a second line option. 4) I will refer the patient to Dr. Robin Searing for dental evaluation before starting treatment with [facility] for his bone disease. 5) for pain management, I would increase his dose of OxyContin to 20 mg by mouth every 12 hours in addition to Percocet for breakthrough pain. The patient would come back for followup visit in 2 weeks for evaluation and discussion of his imaging studies as well as the Veristrat test and further recommendation regarding his second line treatment options. The patient was advised to call immediately if he has any concerning symptoms in the interval. Lajuana Matte., MD 07/01/2013

## 2013-06-26 NOTE — Patient Instructions (Addendum)
Your being scheduled for an MRI of your thoracic and lumbar spine as well as her right hip to further evaluate your complaints of pain We are also doing a Veristrat test on you to see if you respond to an oral chemotherapy drug We will refer you to a dentist for clearance prior to starting a medication to help strengthen your bones Increase your long-acting pain medication from 10 mg by mouth every 12 hours to 20 mg by mouth every 12 hours You may continue to take your Percocet, one tablet by mouth every 6 hours as needed for breakthrough pain Followup with Dr. Arbutus Ped in 2 weeks

## 2013-06-27 ENCOUNTER — Encounter: Payer: Self-pay | Admitting: Physician Assistant

## 2013-06-27 ENCOUNTER — Ambulatory Visit (INDEPENDENT_AMBULATORY_CARE_PROVIDER_SITE_OTHER): Payer: BC Managed Care – PPO | Admitting: Physician Assistant

## 2013-06-27 ENCOUNTER — Telehealth: Payer: Self-pay | Admitting: Medical Oncology

## 2013-06-27 VITALS — BP 149/86 | HR 73 | Temp 97.8°F | Resp 16 | Ht 66.0 in | Wt 138.5 lb

## 2013-06-27 DIAGNOSIS — N400 Enlarged prostate without lower urinary tract symptoms: Secondary | ICD-10-CM

## 2013-06-27 LAB — VERISTRAT

## 2013-06-27 NOTE — Telephone Encounter (Signed)
Diagnosis code 162.9 called to Joleen at [facility] for veristrat testing.

## 2013-06-27 NOTE — Patient Instructions (Signed)
Increase Hytrin to 3 tablets at bedtime.  I would like to see you in 4-6 weeks to reassess.  Benign Prostatic Hyperplasia You have an enlarged prostate. This is common in elderly males. It is called BPH. This stands for benign prostate hyperplasia. The prostate gland is located in base of the bladder. When it grows, the prostate blocks the urethra. This is the tube which drains urine from the bladder.  SYMPTOMS  Weak urine stream.  Dribbling.  Feeling like the bladder has not emptied completely.  Difficulty starting urination.  Getting up frequently at night to urinate.  Urinating more frequently during the day. Complete urinary blockage or severe pain with urination requires immediate attention. DIAGNOSIS   Your caregiver often has a good idea what is wrong by taking a history and doing a physical exam.  Special x-rays may be done. TREATMENT   For mild problems, no treatment may be necessary.  If the problems are moderate, medications may provide relief. Some of these work by making the prostate gland smaller. The herb saw palmetto is commonly used.  If complete blockage occurs, a Foley catheter is usually left in place for a few days.  Surgery is often needed for more severe problems. TURP is the prostate surgery for BPH which is done through the urethra. TURP stands for transurethral resection of the prostate. It involves cutting away chips from the prostate. It is done by removing chips so that they can come out through the penis.  Techniques using heat, microwave and laser to remove the prostate blockage are also being used. HOME CARE INSTRUCTIONS   Give yourself time when you urinate.  Stay away from alcohol.  Beverages containing caffeine such as coffee, tea and colas can make the problems worse.  Decongestants, antihistamines, and some prescription medicines can also make the problem worse.  Follow up with your caregiver for further treatment as recommended. SEEK  IMMEDIATE MEDICAL CARE IF:   You develop increased pain with urination or are unable to pass your water.  You develop severe abdominal pain, vomiting, a high fever, or fainting.  You develop back pain or blood in your urine. MAKE SURE YOU:   Understand these instructions.  Will watch your condition.  Will get help right away if you are not doing well or get worse. Document Released: 09/27/2005 Document Revised: 12/20/2011 Document Reviewed: 06/02/2007 [facility] Patient Information 2014 [place], [place].

## 2013-06-27 NOTE — Assessment & Plan Note (Addendum)
Increase Hytrin to 6 mg QHS.  F/U 4-6 weeks.  Patient declines urology referral.  Understandable given patients cancer and prognosis.

## 2013-06-27 NOTE — Progress Notes (Signed)
Patient ID: Thomas May, male   DOB: [birth year], 67 y.o.   MRN: [id number]  Patient presents to clinic today with daughter for 1 month follow-up of BPH symptoms.  Patient currently on 4mg  terazosin QHS.  Patient has noticed a slight improvement in ability to void, but still notes some difficulty maintaining good stream.  Unfortunately patient has Stage IV lung cancer with metastases.  Since last visit he has suffered a pathologic fracture and has been told that he is developing new tumors.  He has decided to stop chemotherapy for the time being.  Patient is being prescribed Oxycontin from oncology for pain.  Patient and Family decline urology referral at present time.  Understandable given patient's current prognosis.  Past Medical History  Diagnosis Date  . Tobacco use   . Lung mass   . BPH (benign prostatic hyperplasia)   . Lung cancer     Current Outpatient Prescriptions on File Prior to Visit  Medication Sig Dispense Refill  . dexamethasone (DECADRON) 4 MG tablet TAKE 1 TABLET BY MOUTH TWICE A DAY DAY BEFORE DAY OF AND DAY AFTER THE CHEMO THEREAPY EVERY 3 WEEKS  40 tablet  0  . docusate sodium (COLACE) 100 MG capsule Take 100 mg by mouth 2 (two) times daily.      . folic acid (FOLVITE) 1 MG tablet Take 1 tablet (1 mg total) by mouth daily.  30 tablet  2  . lidocaine-prilocaine (EMLA) cream Apply topically as needed. Cover with plastic wrap 1.5 hours before chemo.  30 g  1  . Multiple Vitamin (MULTIVITAMIN) tablet Take 1 tablet by mouth daily.      Marland Kitchen oxyCODONE (OXYCONTIN) 10 MG 12 hr tablet Take 1 tablet (10 mg total) by mouth every 12 (twelve) hours.  60 tablet  0  . oxyCODONE-acetaminophen (PERCOCET/ROXICET) 5-325 MG per tablet Take 1 tablet by mouth every 6 (six) hours as needed for pain. May take 2 tablets PO q 6 hours for severe pain - Do not take with Tylenol as this tablet already contains tylenol  25 tablet  0  . prochlorperazine (COMPAZINE) 10 MG tablet Take 5 mg by mouth every 6 (six)  hours as needed. Take 0.5 tablets (5 mg total) by mouth every 6 (six) hours as needed for Nausea.      . Saw Palmetto, Serenoa repens, (SAW PALMETTO PO) Take 1 capsule by mouth daily.       No current facility-administered medications on file prior to visit.    No Known Allergies  Family History  Problem Relation Age of Onset  . Ulcers Other   . Coronary artery disease Neg Hx     History   Social History  . Marital Status: Married    Spouse Name: N/A    Number of Children: 2  . Years of Education: N/A   Occupational History  . [employer]er Owner    Social History Main Topics  . Smoking status: Former Smoker -- 0.50 packs/day for 40 years    Types: Cigarettes    Quit date: 05/11/2012  . Smokeless tobacco: Never Used     Comment: Patient is using Nicotine patches.  . Alcohol Use: 1.5 oz/week    3 drink(s) per week     Comment: three times weekly  . Drug Use: No  . Sexual Activity: None   Other Topics Concern  . None   Social History Narrative   Occupation: Midwife   Married 34 years   1 son  1 daughter   Review of Systems  Constitutional: Negative for fever and chills.  Cardiovascular: Negative for chest pain and palpitations.  Genitourinary: Negative for dysuria, urgency, frequency, hematuria and flank pain.       Urinary hesitancy, difficulty maintaining stream, nocturia  Musculoskeletal: Positive for back pain and joint pain.  Neurological: Negative for dizziness and loss of consciousness.   Filed Vitals:   06/27/13 1546  BP: 149/86  Pulse: 73  Temp: 97.8 F (36.6 C)  Resp: 16    Physical Exam  Vitals reviewed. Constitutional: He is oriented to person, place, and time.  Cachetic gentleman in mild painful distress, sitting in wheelchair.  HENT:  Head: Normocephalic and atraumatic.  Neck: Neck supple.  Cardiovascular: Normal rate and regular rhythm.   Neurological: He is alert and oriented to person, place, and time.  Skin: Skin is warm and  dry. No rash noted.   Recent Results (from the past 2160 hour(s))  CBC WITH DIFFERENTIAL     Status: Abnormal   Collection Time    04/03/13 11:55 AM      Result Value Range   WBC 5.8  4.0 - 10.3 10e3/uL   NEUT# 4.8  1.5 - 6.5 10e3/uL   HGB 12.5 (*) 13.0 - 17.1 g/dL   HCT 14.7 (*) 82.9 - 56.2 %   Platelets 273  140 - 400 10e3/uL   MCV 94.5  79.3 - 98.0 fL   MCH 32.6  27.2 - 33.4 pg   MCHC 34.4  32.0 - 36.0 g/dL   RBC 1.30 (*) 8.65 - 7.84 10e6/uL   RDW 14.5  11.0 - 14.6 %   lymph# 0.9  0.9 - 3.3 10e3/uL   MONO# 0.1  0.1 - 0.9 10e3/uL   Eosinophils Absolute 0.0  0.0 - 0.5 10e3/uL   Basophils Absolute 0.0  0.0 - 0.1 10e3/uL   NEUT% 83.0 (*) 39.0 - 75.0 %   LYMPH% 15.1  14.0 - 49.0 %   MONO% 1.4  0.0 - 14.0 %   EOS% 0.2  0.0 - 7.0 %   BASO% 0.3  0.0 - 2.0 %  COMPREHENSIVE METABOLIC PANEL (CC13)     Status: Abnormal   Collection Time    04/03/13 11:55 AM      Result Value Range   Sodium 139  136 - 145 mEq/L   Potassium 3.8  3.5 - 5.1 mEq/L   Chloride 109 (*) 98 - 107 mEq/L   CO2 23  22 - 29 mEq/L   Glucose 168 (*) 70 - 99 mg/dl   BUN 69.6  7.0 - 29.5 mg/dL   Creatinine 1.0  0.7 - 1.3 mg/dL   Total Bilirubin 2.84  0.20 - 1.20 mg/dL   Alkaline Phosphatase 97  40 - 150 U/L   AST 26  5 - 34 U/L   ALT 51  0 - 55 U/L   Total Protein 7.8  6.4 - 8.3 g/dL   Albumin 3.5  3.5 - 5.0 g/dL   Calcium 9.9  8.4 - 13.2 mg/dL  CBC WITH DIFFERENTIAL     Status: Abnormal   Collection Time    04/24/13 12:09 PM      Result Value Range   WBC 6.4  4.0 - 10.3 10e3/uL   NEUT# 5.5  1.5 - 6.5 10e3/uL   HGB 13.1  13.0 - 17.1 g/dL   HCT 44.0 (*) 10.2 - 72.5 %   Platelets 277  140 - 400 10e3/uL   MCV 93.8  79.3 - 98.0 fL   MCH 32.8  27.2 - 33.4 pg   MCHC 34.9  32.0 - 36.0 g/dL   RBC 1.61 (*) 0.96 - 0.45 10e6/uL   RDW 13.5  11.0 - 14.6 %   lymph# 0.8 (*) 0.9 - 3.3 10e3/uL   MONO# 0.1  0.1 - 0.9 10e3/uL   Eosinophils Absolute 0.0  0.0 - 0.5 10e3/uL   Basophils Absolute 0.0  0.0 - 0.1 10e3/uL    NEUT% 86.2 (*) 39.0 - 75.0 %   LYMPH% 12.4 (*) 14.0 - 49.0 %   MONO% 0.8  0.0 - 14.0 %   EOS% 0.0  0.0 - 7.0 %   BASO% 0.6  0.0 - 2.0 %   nRBC 0  0 - 0 %  COMPREHENSIVE METABOLIC PANEL (CC13)     Status: Abnormal   Collection Time    04/24/13 12:10 PM      Result Value Range   Sodium 137  136 - 145 mEq/L   Potassium 3.9  3.5 - 5.1 mEq/L   Chloride 103  98 - 109 mEq/L   CO2 23  22 - 29 mEq/L   Glucose 147 (*) 70 - 140 mg/dl   BUN 9.8  7.0 - 40.9 mg/dL   Creatinine 1.0  0.7 - 1.3 mg/dL   Total Bilirubin 8.11  0.20 - 1.20 mg/dL   Alkaline Phosphatase 94  40 - 150 U/L   AST 26  5 - 34 U/L   ALT 24  0 - 55 U/L   Total Protein 7.9  6.4 - 8.3 g/dL   Albumin 3.7  3.5 - 5.0 g/dL   Calcium 91.4  8.4 - 78.2 mg/dL  CBC WITH DIFFERENTIAL     Status: Abnormal   Collection Time    05/15/13 12:01 PM      Result Value Range   WBC 5.9  4.0 - 10.3 10e3/uL   NEUT# 3.1  1.5 - 6.5 10e3/uL   HGB 12.3 (*) 13.0 - 17.1 g/dL   HCT 95.6 (*) 21.3 - 08.6 %   Platelets 270  140 - 400 10e3/uL   MCV 95.5  79.3 - 98.0 fL   MCH 32.4  27.2 - 33.4 pg   MCHC 33.9  32.0 - 36.0 g/dL   RBC 5.78 (*) 4.69 - 6.29 10e6/uL   RDW 14.0  11.0 - 14.6 %   lymph# 2.0  0.9 - 3.3 10e3/uL   MONO# 0.6  0.1 - 0.9 10e3/uL   Eosinophils Absolute 0.3  0.0 - 0.5 10e3/uL   Basophils Absolute 0.0  0.0 - 0.1 10e3/uL   NEUT% 51.5  39.0 - 75.0 %   LYMPH% 34.0  14.0 - 49.0 %   MONO% 9.6  0.0 - 14.0 %   EOS% 4.2  0.0 - 7.0 %   BASO% 0.7  0.0 - 2.0 %  COMPREHENSIVE METABOLIC PANEL (CC13)     Status: Abnormal   Collection Time    05/15/13 12:02 PM      Result Value Range   Sodium 141  136 - 145 mEq/L   Potassium 3.5  3.5 - 5.1 mEq/L   Chloride 108  98 - 109 mEq/L   CO2 24  22 - 29 mEq/L   Glucose 88  70 - 140 mg/dl   BUN 52.8  7.0 - 41.3 mg/dL   Creatinine 0.9  0.7 - 1.3 mg/dL   Total Bilirubin 2.44  0.20 - 1.20 mg/dL   Alkaline Phosphatase 92  40 - 150 U/L   AST 33  5 - 34 U/L   ALT 28  0 - 55 U/L   Total Protein 7.5  6.4  - 8.3 g/dL   Albumin 3.3 (*) 3.5 - 5.0 g/dL   Calcium 16.1  8.4 - 09.6 mg/dL  HEPATIC FUNCTION PANEL     Status: None   Collection Time    05/28/13  4:41 PM      Result Value Range   Total Bilirubin 0.4  0.3 - 1.2 mg/dL   Bilirubin, Direct 0.1  0.0 - 0.3 mg/dL   Indirect Bilirubin 0.3  0.0 - 0.9 mg/dL   Alkaline Phosphatase 93  39 - 117 U/L   AST 27  0 - 37 U/L   ALT 42  0 - 53 U/L   Total Protein 6.7  6.0 - 8.3 g/dL   Albumin 3.8  3.5 - 5.2 g/dL  TSH     Status: None   Collection Time    05/28/13  4:41 PM      Result Value Range   TSH 1.391  0.350 - 4.500 uIU/mL  HEMOGLOBIN A1C     Status: None   Collection Time    05/28/13  4:41 PM      Result Value Range   Hemoglobin A1C 5.5  <5.7 %   Comment:                                                                            According to the ADA Clinical Practice Recommendations for 2011, when     HbA1c is used as a screening test:             >=6.5%   Diagnostic of Diabetes Mellitus                (if abnormal result is confirmed)           5.7-6.4%   Increased risk of developing Diabetes Mellitus           References:Diagnosis and Classification of Diabetes Mellitus,Diabetes     Care,2011,34(Suppl 1):S62-S69 and Standards of Medical Care in             Diabetes - 2011,Diabetes Care,2011,34 (Suppl 1):S11-S61.         Mean Plasma Glucose 111  <117 mg/dL  PSA     Status: Abnormal   Collection Time    05/28/13  4:41 PM      Result Value Range   PSA 6.36 (*) <=4.00 ng/mL   Comment: Test Methodology: ECLIA PSA (Electrochemiluminescence Immunoassay)           For PSA values from 2.5-4.0, particularly in younger men <60 years     old, the AUA and NCCN suggest testing for % Free PSA (3515) and     evaluation of the rate of increase in PSA (PSA velocity).  URINALYSIS, ROUTINE W REFLEX MICROSCOPIC     Status: None   Collection Time    05/28/13  4:41 PM      Result Value Range   Color, Urine YELLOW  YELLOW   APPearance CLEAR   CLEAR   Specific Gravity, Urine 1.016  1.005 - 1.030   pH  7.0  5.0 - 8.0   Glucose, UA NEG  NEG mg/dL   Bilirubin Urine NEG  NEG   Ketones, ur NEG  NEG mg/dL   Hgb urine dipstick NEG  NEG   Protein, ur NEG  NEG mg/dL   Urobilinogen, UA 0.2  0.0 - 1.0 mg/dL   Nitrite NEG  NEG   Leukocytes, UA NEG  NEG  LIPID PANEL     Status: Abnormal   Collection Time    05/28/13  4:41 PM      Result Value Range   Cholesterol 204 (*) 0 - 200 mg/dL   Comment: ATP III Classification:           < 200        mg/dL        Desirable          200 - 239     mg/dL        Borderline High          >= 240        mg/dL        High         Triglycerides 168 (*) <150 mg/dL   HDL 52  >62 mg/dL   Total CHOL/HDL Ratio 3.9     VLDL 34  0 - 40 mg/dL   LDL Cholesterol 952 (*) 0 - 99 mg/dL   Comment:       Total Cholesterol/HDL Ratio:CHD Risk                            Coronary Heart Disease Risk Table                                            Men       Women              1/2 Average Risk              3.4        3.3                  Average Risk              5.0        4.4               2X Average Risk              9.6        7.1               3X Average Risk             23.4       11.0     Use the calculated Patient Ratio above and the CHD Risk table      to determine the patient's CHD Risk.     ATP III Classification (LDL):           < 100        mg/dL         Optimal          100 - 129     mg/dL         Near or Above Optimal          130 - 159     mg/dL  Borderline High          160 - 189     mg/dL         High           > 190        mg/dL         Very High        CBC WITH DIFFERENTIAL     Status: Abnormal   Collection Time    06/05/13 11:17 AM      Result Value Range   WBC 6.5  4.0 - 10.3 10e3/uL   NEUT# 3.3  1.5 - 6.5 10e3/uL   HGB 12.4 (*) 13.0 - 17.1 g/dL   HCT 16.1 (*) 09.6 - 04.5 %   Platelets 331  140 - 400 10e3/uL   MCV 94.2  79.3 - 98.0 fL   MCH 32.8  27.2 - 33.4 pg   MCHC 34.8   32.0 - 36.0 g/dL   RBC 4.09 (*) 8.11 - 9.14 10e6/uL   RDW 14.2  11.0 - 14.6 %   lymph# 2.3  0.9 - 3.3 10e3/uL   MONO# 0.7  0.1 - 0.9 10e3/uL   Eosinophils Absolute 0.2  0.0 - 0.5 10e3/uL   Basophils Absolute 0.0  0.0 - 0.1 10e3/uL   NEUT% 50.7  39.0 - 75.0 %   LYMPH% 35.7  14.0 - 49.0 %   MONO% 10.5  0.0 - 14.0 %   EOS% 2.5  0.0 - 7.0 %   BASO% 0.6  0.0 - 2.0 %   nRBC 0  0 - 0 %  COMPREHENSIVE METABOLIC PANEL (CC13)     Status: Abnormal   Collection Time    06/05/13 11:17 AM      Result Value Range   Sodium 141  136 - 145 mEq/L   Potassium 4.2  3.5 - 5.1 mEq/L   Chloride 110 (*) 98 - 109 mEq/L   CO2 19 (*) 22 - 29 mEq/L   Glucose 109  70 - 140 mg/dl   BUN 78.2  7.0 - 95.6 mg/dL   Creatinine 0.9  0.7 - 1.3 mg/dL   Total Bilirubin 2.13  0.20 - 1.20 mg/dL   Alkaline Phosphatase 101  40 - 150 U/L   AST 37 (*) 5 - 34 U/L   ALT 34  0 - 55 U/L   Total Protein 7.9  6.4 - 8.3 g/dL   Albumin 3.4 (*) 3.5 - 5.0 g/dL   Calcium 9.7  8.4 - 08.6 mg/dL  CBC WITH DIFFERENTIAL     Status: Abnormal   Collection Time    06/18/13  5:12 PM      Result Value Range   WBC 15.1 (*) 4.0 - 10.5 K/uL   RBC 4.09 (*) 4.22 - 5.81 MIL/uL   Hemoglobin 13.4  13.0 - 17.0 g/dL   HCT 57.8  46.9 - 62.9 %   MCV 95.6  78.0 - 100.0 fL   MCH 32.8  26.0 - 34.0 pg   MCHC 34.3  30.0 - 36.0 g/dL   RDW 52.8  41.3 - 24.4 %   Platelets 218  150 - 400 K/uL   Neutrophils Relative % 82 (*) 43 - 77 %   Lymphocytes Relative 11 (*) 12 - 46 %   Monocytes Relative 7  3 - 12 %   Eosinophils Relative 0  0 - 5 %   Basophils Relative 0  0 - 1 %   Neutro Abs 12.3 (*)  1.7 - 7.7 K/uL   Lymphs Abs 1.7  0.7 - 4.0 K/uL   Monocytes Absolute 1.1 (*) 0.1 - 1.0 K/uL   Eosinophils Absolute 0.0  0.0 - 0.7 K/uL   Basophils Absolute 0.0  0.0 - 0.1 K/uL  COMPREHENSIVE METABOLIC PANEL     Status: Abnormal   Collection Time    06/18/13  5:12 PM      Result Value Range   Sodium 137  135 - 145 mEq/L   Potassium 3.6  3.5 - 5.1 mEq/L    Chloride 100  96 - 112 mEq/L   CO2 23  19 - 32 mEq/L   Glucose, Bld 130 (*) 70 - 99 mg/dL   BUN 12  6 - 23 mg/dL   Creatinine, Ser 6.29  0.50 - 1.35 mg/dL   Calcium 52.8  8.4 - 41.3 mg/dL   Total Protein 7.8  6.0 - 8.3 g/dL   Albumin 3.5  3.5 - 5.2 g/dL   AST 34  0 - 37 U/L   ALT 38  0 - 53 U/L   Alkaline Phosphatase 110  39 - 117 U/L   Total Bilirubin 0.6  0.3 - 1.2 mg/dL   GFR calc non Af Amer >90  >90 mL/min   GFR calc Af Amer >90  >90 mL/min   Comment: (NOTE)     The eGFR has been calculated using the CKD EPI equation.     This calculation has not been validated in all clinical situations.     eGFR's persistently <90 mL/min signify possible Chronic Kidney     Disease.  ACETAMINOPHEN LEVEL     Status: None   Collection Time    06/18/13  5:12 PM      Result Value Range   Acetaminophen (Tylenol), Serum <15.0  10 - 30 ug/mL   Comment:            THERAPEUTIC CONCENTRATIONS VARY     SIGNIFICANTLY. A RANGE OF 10-30     ug/mL MAY BE AN EFFECTIVE     CONCENTRATION FOR MANY PATIENTS.     HOWEVER, SOME ARE BEST TREATED     AT CONCENTRATIONS OUTSIDE THIS     RANGE.     ACETAMINOPHEN CONCENTRATIONS     >150 ug/mL AT 4 HOURS AFTER     INGESTION AND >50 ug/mL AT 12     HOURS AFTER INGESTION ARE     OFTEN ASSOCIATED WITH TOXIC     REACTIONS.  URINALYSIS, ROUTINE W REFLEX MICROSCOPIC     Status: Abnormal   Collection Time    06/18/13  5:30 PM      Result Value Range   Color, Urine YELLOW  YELLOW   APPearance CLEAR  CLEAR   Specific Gravity, Urine 1.009  1.005 - 1.030   pH 6.5  5.0 - 8.0   Glucose, UA NEGATIVE  NEGATIVE mg/dL   Hgb urine dipstick SMALL (*) NEGATIVE   Bilirubin Urine NEGATIVE  NEGATIVE   Ketones, ur NEGATIVE  NEGATIVE mg/dL   Protein, ur NEGATIVE  NEGATIVE mg/dL   Urobilinogen, UA 0.2  0.0 - 1.0 mg/dL   Nitrite NEGATIVE  NEGATIVE   Leukocytes, UA NEGATIVE  NEGATIVE  URINE MICROSCOPIC-ADD ON     Status: None   Collection Time    06/18/13  5:30 PM      Result  Value Range   Squamous Epithelial / LPF RARE  RARE   RBC / HPF 7-10  <3 RBC/hpf  Bacteria, UA RARE  RARE  CBC WITH DIFFERENTIAL     Status: Abnormal   Collection Time    06/26/13 11:15 AM      Result Value Range   WBC 9.8  4.0 - 10.3 10e3/uL   NEUT# 6.8 (*) 1.5 - 6.5 10e3/uL   HGB 13.5  13.0 - 17.1 g/dL   HCT 40.9  81.1 - 91.4 %   Platelets 251  140 - 400 10e3/uL   MCV 93.3  79.3 - 98.0 fL   MCH 32.4  27.2 - 33.4 pg   MCHC 34.7  32.0 - 36.0 g/dL   RBC 7.82 (*) 9.56 - 2.13 10e6/uL   RDW 13.4  11.0 - 14.6 %   lymph# 2.0  0.9 - 3.3 10e3/uL   MONO# 0.8  0.1 - 0.9 10e3/uL   Eosinophils Absolute 0.2  0.0 - 0.5 10e3/uL   Basophils Absolute 0.1  0.0 - 0.1 10e3/uL   NEUT% 69.7  39.0 - 75.0 %   LYMPH% 20.2  14.0 - 49.0 %   MONO% 7.7  0.0 - 14.0 %   EOS% 1.8  0.0 - 7.0 %   BASO% 0.6  0.0 - 2.0 %  COMPREHENSIVE METABOLIC PANEL (CC13)     Status: Abnormal   Collection Time    06/26/13 11:15 AM      Result Value Range   Sodium 138  136 - 145 mEq/L   Potassium 3.8  3.5 - 5.1 mEq/L   Chloride 103  98 - 109 mEq/L   CO2 26  22 - 29 mEq/L   Glucose 114  70 - 140 mg/dl   BUN 08.6  7.0 - 57.8 mg/dL   Creatinine 1.0  0.7 - 1.3 mg/dL   Total Bilirubin 4.69  0.20 - 1.20 mg/dL   Alkaline Phosphatase 118  40 - 150 U/L   AST 29  5 - 34 U/L   ALT 47  0 - 55 U/L   Total Protein 8.1  6.4 - 8.3 g/dL   Albumin 3.3 (*) 3.5 - 5.0 g/dL   Calcium 62.9  8.4 - 52.8 mg/dL   Assessment/Plan: BPH (benign prostatic hyperplasia) Increase Hytrin to 6 mg QHS.  F/U 4-6 weeks.  Patient declines urology referral.  Understandable given patients cancer and prognosis.

## 2013-06-27 NOTE — Progress Notes (Addendum)
Histology and Location of Primary Cancer:Adenocarcinoma of lung ([facility])   Sites of Visceral and Bony Metastatic Disease: Pathological compression fractures of T12, L4-5 and 5 mm nonobstructive left renal calculus  Location(s) of Symptomatic Metastases:   Past/Anticipated chemotherapy by medical oncology, if ZOX:[id number] chemotherapy to include 4 cycles carboplatin with mainternance Alimta  Pain on a scale of 0-10 is:"6" pain of right lower back, buttock started about 2.5 weeks ago.    If Spine Met(s), symptoms, if any, include:  Bowel/Bladder retention or incontinence (please describe):constipated   Numbness or weakness in extremities (please describe):Denies numbness, difficult to ambulate because of pain.  Current Decadron regimen for routine with chemotherapy.  Ambulatory: Uses walker at home but utilized wheelchair here at cancer facility.  SAFETY ISSUES:  Prior radiation?No  Pacemaker/ICD? No  Possible current pregnancy? N/A  Is the patient on methotrexate? no  Current Complaints / other details:Patient here with son Dannielle Huh and [place] interpreter.Patient does understand some english.Patient main concern is to get pain relief of right lower back/hip/buttock.Does have some constipation, instructed to put miralax in something warm and try twice daily until he gets some relief.Currently takes oxycontin bid with percocet for breakthrough pain.

## 2013-06-28 ENCOUNTER — Ambulatory Visit
Admission: RE | Admit: 2013-06-28 | Discharge: 2013-06-28 | Disposition: A | Discharge status: Home / Self Care | Payer: BC Managed Care – PPO | Source: Ambulatory Visit | Attending: Radiation Oncology | Admitting: Radiation Oncology

## 2013-06-28 ENCOUNTER — Encounter: Payer: Self-pay | Admitting: Radiation Oncology

## 2013-06-28 VITALS — BP 140/83 | HR 77 | Temp 98.7°F | Wt 139.5 lb

## 2013-06-28 DIAGNOSIS — I700 Atherosclerosis of aorta: Secondary | ICD-10-CM | POA: Insufficient documentation

## 2013-06-28 DIAGNOSIS — Z79899 Other long term (current) drug therapy: Secondary | ICD-10-CM | POA: Insufficient documentation

## 2013-06-28 DIAGNOSIS — M79609 Pain in unspecified limb: Secondary | ICD-10-CM | POA: Insufficient documentation

## 2013-06-28 DIAGNOSIS — C801 Malignant (primary) neoplasm, unspecified: Secondary | ICD-10-CM | POA: Insufficient documentation

## 2013-06-28 DIAGNOSIS — C3490 Malignant neoplasm of unspecified part of unspecified bronchus or lung: Secondary | ICD-10-CM | POA: Insufficient documentation

## 2013-06-28 DIAGNOSIS — Z510 Encounter for antineoplastic radiation therapy: Secondary | ICD-10-CM | POA: Insufficient documentation

## 2013-06-28 DIAGNOSIS — M549 Dorsalgia, unspecified: Secondary | ICD-10-CM | POA: Insufficient documentation

## 2013-06-28 DIAGNOSIS — K5900 Constipation, unspecified: Secondary | ICD-10-CM | POA: Insufficient documentation

## 2013-06-28 DIAGNOSIS — I2510 Atherosclerotic heart disease of native coronary artery without angina pectoris: Secondary | ICD-10-CM | POA: Insufficient documentation

## 2013-06-28 DIAGNOSIS — C7951 Secondary malignant neoplasm of bone: Secondary | ICD-10-CM | POA: Insufficient documentation

## 2013-06-28 MED ORDER — HYDROMORPHONE HCL PF 1 MG/ML IJ SOLN
INTRAMUSCULAR | Status: AC
  Filled 2013-06-28: qty 1

## 2013-06-28 MED ORDER — HYDROMORPHONE HCL PF 4 MG/ML IJ SOLN
1.0000 mg | Freq: Once | INTRAMUSCULAR | Status: DC
  Filled 2013-06-28: qty 1

## 2013-06-28 MED ORDER — HYDROMORPHONE HCL PF 1 MG/ML IJ SOLN
1.0000 mg | Freq: Once | INTRAMUSCULAR | Status: AC
  Administered 2013-06-28: 1 mg via INTRAMUSCULAR
  Filled 2013-06-28: qty 1

## 2013-06-28 NOTE — Progress Notes (Signed)
Radiation Oncology         [id number]) [phone] ________________________________  Initial outpatient Consultation - Date: 06/28/2013   Name: Thomas May MRN: [id number]   DOB: July 27, 1946  REFERRING PHYSICIAN: Conni Slipper, PA-C  DIAGNOSIS: Metastatic lung cancer with right leg and back pain  HISTORY OF PRESENT ILLNESS::Thomas May is a 67 y.o. male  who was diagnosed with metastatic non-small cell lung cancer in August of last year. A bronchoscopy and biopsy was performed in August which showed non-small cell carcinoma favoring adenocarcinoma. EEG) of testing were negative. A CT of the chest confirmed multiple bilateral pulmonary nodules and right hilar adenopathy. A PET/CT on 06/26/2012 showed uptake in multiple hypermetabolic supraclavicular lymph nodes with good index node measuring S. U V. of 4.4. Hypermetabolic mediastinal and hilar lymphadenopathy was noted with a max SUV of 4.7 and a right peritracheal lymph node. Hypermetabolic pulmonary nodules or masses were noted with the largest measuring 5 x 4.5 cm in the right middle lobe. He has been undergoing chemotherapy with Alimta and is status post 9 cycles. He underwent recent restaging studies on 06/18/2013 which showed the right middle lobe mass had increased in size measuring 3.2 x 2.0 cm which is enlarged from July. Interval enlargement had been noted of aggressive appearing lesions in the skeleton associated with pathologic compression involving T12-L4 and L5. About 2 and half weeks ago he began experiencing right-sided back pain. He describes this as worse when lying down and walking for extended periods of time. He describes this as a stabbing-type sensation which radiates down his right leg. He has difficulties walking secondary to the pain but not secondary to weakness. He is having some constipation problems related to the pain medication he is taking which include the OxyContin twice a day with Percocet in between. He states that Percocet takes  is pain from a 5-2 and last for a couple hours and then he has reappearance of the pain. He has no pain related to his midthoracic area. He is accompanied by a interpreter as well as his son. He has no pulmonary complaints today.  4PREVIOUS RADIATION THERAPY: No  PAST MEDICAL HISTORY:  has a past medical history of Tobacco use; Lung mass; BPH (benign prostatic hyperplasia); and Lung cancer.    PAST SURGICAL HISTORY: Past Surgical History  Procedure Laterality Date  . Video bronchoscopy  05/31/2012    Procedure: VIDEO BRONCHOSCOPY WITH FLUORO;  Surgeon: Nyoka Cowden, MD;  Location: [facility];  Service: Endoscopy;  Laterality: Bilateral;    FAMILY HISTORY:  Family History  Problem Relation Age of Onset  . Ulcers Other   . Coronary artery disease Neg Hx     SOCIAL HISTORY:  History  Substance Use Topics  . Smoking status: Former Smoker -- 0.50 packs/day for 40 years    Types: Cigarettes    Quit date: 05/11/2012  . Smokeless tobacco: Never Used     Comment: Patient is using Nicotine patches.  . Alcohol Use: 1.5 oz/week    3 drink(s) per week     Comment: three times weekly    ALLERGIES: Review of patient's allergies indicates no known allergies.  MEDICATIONS:  Current Outpatient Prescriptions  Medication Sig Dispense Refill  . dexamethasone (DECADRON) 4 MG tablet TAKE 1 TABLET BY MOUTH TWICE A DAY DAY BEFORE DAY OF AND DAY AFTER THE CHEMO THEREAPY EVERY 3 WEEKS  40 tablet  0  . docusate sodium (COLACE) 100 MG capsule Take 100 mg by mouth 2 (two) times  daily.      . folic acid (FOLVITE) 1 MG tablet Take 1 tablet (1 mg total) by mouth daily.  30 tablet  2  . lidocaine-prilocaine (EMLA) cream Apply topically as needed. Cover with plastic wrap 1.5 hours before chemo.  30 g  1  . Multiple Vitamin (MULTIVITAMIN) tablet Take 1 tablet by mouth daily.      Marland Kitchen oxyCODONE (OXYCONTIN) 10 MG 12 hr tablet Take 1 tablet (10 mg total) by mouth every 12 (twelve) hours.  60 tablet  0  .  oxyCODONE-acetaminophen (PERCOCET/ROXICET) 5-325 MG per tablet Take 1 tablet by mouth every 6 (six) hours as needed for pain. May take 2 tablets PO q 6 hours for severe pain - Do not take with Tylenol as this tablet already contains tylenol  25 tablet  0  . polyethylene glycol (MIRALAX / GLYCOLAX) packet Take 17 g by mouth daily.      . prochlorperazine (COMPAZINE) 10 MG tablet Take 5 mg by mouth every 6 (six) hours as needed. Take 0.5 tablets (5 mg total) by mouth every 6 (six) hours as needed for Nausea.      . Saw Palmetto, Serenoa repens, (SAW PALMETTO PO) Take 1 capsule by mouth daily.      Marland Kitchen terazosin (HYTRIN) 2 MG capsule Take 4 mg by mouth at bedtime.       No current facility-administered medications for this encounter.   Facility-Administered Medications Ordered in Other Encounters  Medication Dose Route Frequency Provider Last Rate Last Dose  . HYDROmorphone (DILAUDID) 1 MG/ML injection           . HYDROmorphone (DILAUDID) injection 1 mg  1 mg Intramuscular Once Lurline Hare, MD        REVIEW OF SYSTEMS:  A 15 point review of systems is documented in the electronic medical record. This was obtained by the nursing staff. However, I reviewed this with the patient to discuss relevant findings and make appropriate changes.  Pertinent items are noted in HPI.  PHYSICAL EXAM: There were no vitals filed for this visit.. . He is a pleasant male ECoG performance status 1 in no distress sitting comfortably on examining table. He has 5 out of 5 strength in his bilateral lower extremities. He has no pain to palpation of the spine. He has no sensory deficits in his legs.  LABORATORY DATA:  Lab Results  Component Value Date   WBC 9.8 06/26/2013   HGB 13.5 06/26/2013   HCT 38.9 06/26/2013   MCV 93.3 06/26/2013   PLT 251 06/26/2013   Lab Results  Component Value Date   NA 138 06/26/2013   K 3.8 06/26/2013   CL 100 06/18/2013   CO2 26 06/26/2013   Lab Results  Component Value Date   ALT 47  06/26/2013   AST 29 06/26/2013   ALKPHOS 118 06/26/2013   BILITOT 0.82 06/26/2013     RADIOGRAPHY: Ct Abdomen Pelvis Wo Contrast  06/18/2013   *RADIOLOGY REPORT*  Clinical Data: Back pain.  Metastatic lung cancer.  CT ABDOMEN AND PELVIS WITHOUT CONTRAST  Technique:  Multidetector CT imaging of the abdomen and pelvis was performed following the standard protocol without intravenous contrast.  Comparison: CT of the abdomen and pelvis 04/23/2013.  Findings:  Lung Bases: Incompletely visualized right middle lobe mass measuring at least 3.2 x 2.0 cm appears increased in size compared to the prior study 04/23/2013.  A trace amount of pleural thickening and/or fluid bilaterally.  The tip of a central  venous catheter in the superior aspect of the right atrium.  A small hiatal hernia.  Abdomen/Pelvis:  5 mm nonobstructive calculus in the upper pole collecting system of the left kidney.  No additional calculi within the right renal collecting system and or along the course of either ureter.  No hydroureteronephrosis or perinephric stranding to suggest urinary tract obstruction at this time.  Several bladder calculi are noted, and there is a diverticulum extending off the posterolateral aspect of the urinary bladder on the left side which also contains calculi.  The largest of these calculi is within the left diverticulum measuring 21 mm in length.  The unenhanced appearance of the liver, gallbladder, pancreas, spleen and bilateral adrenal glands is unremarkable.  There is atherosclerosis throughout the abdominal and pelvic vasculature, without evidence of aneurysm.  No significant volume of ascites. No pneumoperitoneum.  No pathologic distension of small bowel.  No definite pathologic lymphadenopathy identified within the abdomen or pelvis on today's noncontrast CT examination.  Normal appendix.  Musculoskeletal: Compared to the prior study there has been interval enlargement of numerous predominately lytic aggressive  appearing lesions throughout the visualized axial and appendicular skeleton, compatible with progression of metastatic disease to the bones.  Several of these lesions in the spine appear to be associated with some pathologic compression, most notably involving the superior endplate of T12 posteriorly, the posterior aspect of L4 on the right side, where there is a small amount of a the soft tissue impinging upon the underlying spinal canal, and the posterolateral aspect of L5.  IMPRESSION: 1.  Progression of metastatic disease to the lungs and bones with interval development of pathologic compression fractures at T12, L4- L5, as above. 2.  5 mm nonobstructive calculus in the left renal collecting system and multiple bladder calculi redemonstrated, as above. 3.  Extensive atherosclerosis. 4.  Additional incidental findings, as above.   Original Report Authenticated By: Trudie Reed, M.D.   Dg Lumbar Spine Complete  06/18/2013   *RADIOLOGY REPORT*  Clinical Data: Pain.  LUMBAR SPINE - COMPLETE 4+ VIEW  Comparison: 04/23/2013  Findings: The bones are diffusely osteopenic.  Normal alignment of the lumbar spine.  The vertebral body heights are well preserved. There is multilevel disc space narrowing and ventral endplate spurring consistent with spondylosis.  Facet hypertrophy and degenerative changes also noted.  There is a calcified atherosclerotic disease affecting the abdominal aorta. Stone within the upper pole of the left kidney measures 3 mm.  Bladder calculi are again identified including stones within the bladder diverticula.  IMPRESSION:  1.  Osteopenia and mild spondylosis.   Original Report Authenticated By: Signa Kell, M.D.   Dg Hip Complete Right  06/18/2013   *RADIOLOGY REPORT*  Clinical Data: Back pain.  RIGHT HIP - COMPLETE 2+ VIEW  Comparison: None  Findings: There is no evidence of fracture or dislocation.  There is no evidence of arthropathy or other focal bone abnormality. Soft tissues are  unremarkable.  IMPRESSION: Negative exam.   Original Report Authenticated By: Signa Kell, M.D.   Ct Chest W Contrast  06/22/2013   CLINICAL DATA:  History of lung cancer diagnosed in August 2013. Chemotherapy in progress.  EXAM: CT CHEST WITH CONTRAST  TECHNIQUE: Multidetector CT imaging of the chest was performed during intravenous contrast administration.  CONTRAST:  100 OMNIPAQUE IOHEXOL 300 MG/ML SOLN, 80mL OMNIPAQUE IOHEXOL 300 MG/ML SOLN  COMPARISON:  CT of the chest, abdomen and pelvis 04/23/2013.  FINDINGS: Mediastinum: Heart size is mildly enlarged. There is no  significant pericardial fluid, thickening or pericardial calcification. There is atherosclerosis of the thoracic aorta, the great vessels of the mediastinum and the coronary arteries, including calcified atherosclerotic plaque in the left main, left anterior descending and left circumflex coronary arteries. No pathologically enlarged mediastinal or hilar lymph nodes. Esophagus is unremarkable in appearance. Right internal jugular single-lumen porta cath with tip terminating at the superior cavoatrial junction.  Lungs/Pleura: Compared to the prior study, the previously noted right middle lobe pulmonary nodule has significantly increased in size, currently a 3.3 x 2.0 cm mass with macrolobulated and spiculated margins. Additionally, the previously noted satellite nodules have also increased, largest of which measures 1.1 cm in diameter (image 31 of series 5). Surrounding postobstructive changes are noted in the adjacent lung parenchyma in the right middle lobe. In addition, there is a new peripheral nodular opacity in the lateral aspect of the right lower lobe measuring approximately 1.2 x 1.0 cm (image 22 of series 5). Previously noted 8 mm right upper lobe nodule (image 16 of series 5) and a right upper lobe pulmonary nodule measuring 4 mm (image 13 of series 5) are both unchanged. Small amount of pleural thickening in the thorax bilaterally.  No frank pleural effusions.  Upper Abdomen: Mild intrahepatic biliary ductal dilatation. 11 mm low-attenuation lesion in segment 8 of the liver is unchanged. 5 mm nonobstructive calculus in the upper pole collecting system of the left kidney is unchanged.  Musculoskeletal: In the posterolateral aspect of the left 1st rib and lateral aspect of the left 3rd rib there are lytic lesions which have a "moth-eaten" appearance, compatible with metastatic lesions. In addition, there is a mixed lytic and sclerotic lesion in T12 vertebral body posteriorly which has enlarged compared to the prior examination. Additionally, there is a lytic lesion involving the lamina of T12 on the left.  IMPRESSION: 1. Progression of disease as demonstrated by enlargement of the primary right middle lobe mass, increased size of satellite nodules in the right middle lobe, new suspicious nodule in the right lower lobe, and progression of metastatic disease to the bones, as detailed above. 2. Cardiomegaly with left main and 2 vessel coronary artery disease. 3. Additional incidental findings, as above, similar prior studies.   Electronically Signed   By: Trudie Reed M.D.   On: 06/22/2013 17:01      IMPRESSION: Metastatic non-small cell lung cancer to the spine with right-sided back and leg pain  PLAN: I recommended palliative radiation to the patient and his family. We discussed the process of simulation the placement tattoos. We discussed the possible side effects of treatment the most common of which being nausea diarrhea and esophagitis. I was able to fit them in for simulation today and we'll plan on starting his treatment next week after receiving the results of his MRI. He has signed informed consent and agree to proceed forward. I will give him I am delighted prior to simulation to to the hardness of the table which will hopefully help him tolerate his simulation better.  I spent 60 minutes  face to face with the patient and more  than 50% of that time was spent in counseling and/or coordination of care.   ------------------------------------------------  Lurline Hare, MD

## 2013-06-28 NOTE — Progress Notes (Signed)
Patient tolerated ct simulation without difficulty post administration of 1 mg dilaudid.Instruced son to maintain safety  aspain medication may cause drowsiness and to report if he has any itching or other problems.

## 2013-06-28 NOTE — Progress Notes (Signed)
Please see the Nurse Progress Note in the MD Initial Consult Encounter for this patient. 

## 2013-06-28 NOTE — Progress Notes (Signed)
Name: Thomas May   MRN: [id number]  Date:  06/28/2013  DOB: [birth year]  Status:outpatient    DIAGNOSIS: Metastatic lung cancer  CONSENT VERIFIED: yes   SET UP: Patient is setup supine   IMMOBILIZATION:  The following immobilization was used: vacloc  NARRATIVE:  Pt Kilgour was brought to the CT Simulation planning suite.  Identity was confirmed.  All relevant records and images related to the planned course of therapy were reviewed.  Then, the patient was positioned in a stable reproducible clinical set-up for radiation therapy.  CT images were obtained.  An isocenter was placed. Skin markings were placed.  The CT images were loaded into the planning software where the target and avoidance structures were contoured.  The radiation prescription was entered and confirmed. The patient was discharged in stable condition and tolerated simulation well.    TREATMENT PLANNING NOTE:  Treatment planning then occurred. I have requested : MLC's, isodose plan, basic dose calculation  I have requested 3 dimensional simulation with DVH of cord, kidneys, bowel and GTV  I personally supervised and approved the creation of 3 complex treatment devices.

## 2013-07-03 ENCOUNTER — Ambulatory Visit (HOSPITAL_COMMUNITY)
Admission: RE | Admit: 2013-07-03 | Discharge: 2013-07-03 | Disposition: A | Discharge status: Home / Self Care | Payer: BC Managed Care – PPO | Source: Ambulatory Visit | Attending: Physician Assistant | Admitting: Physician Assistant

## 2013-07-03 ENCOUNTER — Other Ambulatory Visit: Payer: Self-pay | Admitting: Physician Assistant

## 2013-07-03 DIAGNOSIS — C3490 Malignant neoplasm of unspecified part of unspecified bronchus or lung: Secondary | ICD-10-CM

## 2013-07-03 DIAGNOSIS — M25559 Pain in unspecified hip: Secondary | ICD-10-CM | POA: Insufficient documentation

## 2013-07-03 DIAGNOSIS — M8448XA Pathological fracture, other site, initial encounter for fracture: Secondary | ICD-10-CM | POA: Insufficient documentation

## 2013-07-03 DIAGNOSIS — M4804 Spinal stenosis, thoracic region: Secondary | ICD-10-CM | POA: Insufficient documentation

## 2013-07-03 DIAGNOSIS — M545 Low back pain, unspecified: Secondary | ICD-10-CM | POA: Insufficient documentation

## 2013-07-03 DIAGNOSIS — N210 Calculus in bladder: Secondary | ICD-10-CM | POA: Insufficient documentation

## 2013-07-03 DIAGNOSIS — C7951 Secondary malignant neoplasm of bone: Secondary | ICD-10-CM | POA: Insufficient documentation

## 2013-07-03 MED ORDER — GADOBENATE DIMEGLUMINE 529 MG/ML IV SOLN
12.0000 mL | Freq: Once | INTRAVENOUS | Status: AC | PRN
  Administered 2013-07-03: 12 mL via INTRAVENOUS

## 2013-07-04 ENCOUNTER — Ambulatory Visit
Admission: RE | Admit: 2013-07-04 | Discharge: 2013-07-04 | Disposition: A | Discharge status: Home / Self Care | Payer: BC Managed Care – PPO | Source: Ambulatory Visit | Attending: Radiation Oncology | Admitting: Radiation Oncology

## 2013-07-04 DIAGNOSIS — C3490 Malignant neoplasm of unspecified part of unspecified bronchus or lung: Secondary | ICD-10-CM

## 2013-07-04 MED ORDER — OXYCODONE HCL 20 MG PO TB12: 20.0000 mg | ORAL_TABLET | Freq: Two times a day (BID) | ORAL | Status: DC

## 2013-07-04 NOTE — Progress Notes (Signed)
  Radiation Oncology         [phone] ________________________________  Name: Thomas May MRN: [id number]  Date: 07/04/2013  DOB: [birth year]  Simulation Verification Note  Status: outpatient  NARRATIVE: The patient was brought to the treatment unit and placed in the planned treatment position. The clinical setup was verified. Then port films were obtained and uploaded to the radiation oncology medical record software.  The treatment beams were carefully compared against the planned radiation fields. The position location and shape of the radiation fields was reviewed. The targeted volume of tissue appears appropriately covered by the radiation beams. Organs at risk appear to be excluded as planned.  Based on my personal review, I approved the simulation verification. The patient's treatment will proceed as planned.  ------------------------------------------------  Lurline Hare, MD

## 2013-07-05 ENCOUNTER — Ambulatory Visit
Admission: RE | Admit: 2013-07-05 | Discharge: 2013-07-05 | Disposition: A | Discharge status: Home / Self Care | Payer: BC Managed Care – PPO | Source: Ambulatory Visit | Attending: Radiation Oncology | Admitting: Radiation Oncology

## 2013-07-05 DIAGNOSIS — C3490 Malignant neoplasm of unspecified part of unspecified bronchus or lung: Secondary | ICD-10-CM

## 2013-07-05 MED ORDER — BIAFINE EX EMUL
CUTANEOUS | Status: DC | PRN
  Administered 2013-07-05: 12:00:00 via TOPICAL

## 2013-07-05 NOTE — Progress Notes (Signed)
Patient, son and interpreter here for patient education.Sxplained routine of clinic and that he will be seen weekly on Tuesday by doctor unless schedule change.side effects to include skin changes, fatigue and pain.Given Radiation Therapy and You Booklet and biafine to apply to skin twice daily.Instructed daughter on yesterday to get biotene for oral mucosa as she states patient gums are sore.To discuss next step in chemotherapy regime with Dr.Mohamed next week.Encouraged to drink water and eat protein to promote healing.Explained what area of l/s spine is being treatment but to discuss further next week.Knows to inform therapist if has problems/questions when coming to treatment.

## 2013-07-06 ENCOUNTER — Ambulatory Visit
Admission: RE | Admit: 2013-07-06 | Discharge: 2013-07-06 | Disposition: A | Discharge status: Home / Self Care | Payer: BC Managed Care – PPO | Source: Ambulatory Visit | Attending: Radiation Oncology | Admitting: Radiation Oncology

## 2013-07-06 NOTE — Addendum Note (Signed)
Encounter addended by: Ayden Apodaca Mintz Rob Mciver, RN on: 07/06/2013  9:18 AM<BR>     Documentation filed: Charges VN

## 2013-07-09 ENCOUNTER — Ambulatory Visit
Admission: RE | Admit: 2013-07-09 | Discharge: 2013-07-09 | Disposition: A | Discharge status: Home / Self Care | Payer: BC Managed Care – PPO | Source: Ambulatory Visit | Attending: Radiation Oncology | Admitting: Radiation Oncology

## 2013-07-10 ENCOUNTER — Encounter: Payer: Self-pay | Admitting: Internal Medicine

## 2013-07-10 ENCOUNTER — Other Ambulatory Visit: Payer: Self-pay | Admitting: Medical Oncology

## 2013-07-10 ENCOUNTER — Ambulatory Visit (HOSPITAL_BASED_OUTPATIENT_CLINIC_OR_DEPARTMENT_OTHER): Payer: BC Managed Care – PPO | Admitting: Internal Medicine

## 2013-07-10 ENCOUNTER — Ambulatory Visit
Admission: RE | Admit: 2013-07-10 | Discharge: 2013-07-10 | Disposition: A | Discharge status: Home / Self Care | Payer: BC Managed Care – PPO | Source: Ambulatory Visit | Attending: Radiation Oncology | Admitting: Radiation Oncology

## 2013-07-10 VITALS — BP 134/87 | HR 81 | Temp 98.0°F | Wt 135.0 lb

## 2013-07-10 VITALS — BP 140/90 | HR 82 | Temp 97.4°F | Resp 19 | Ht 66.0 in | Wt 134.6 lb

## 2013-07-10 DIAGNOSIS — M8430XS Stress fracture, unspecified site, sequela: Secondary | ICD-10-CM

## 2013-07-10 DIAGNOSIS — C3491 Malignant neoplasm of unspecified part of right bronchus or lung: Secondary | ICD-10-CM

## 2013-07-10 DIAGNOSIS — C3490 Malignant neoplasm of unspecified part of unspecified bronchus or lung: Secondary | ICD-10-CM

## 2013-07-10 DIAGNOSIS — R52 Pain, unspecified: Secondary | ICD-10-CM

## 2013-07-10 DIAGNOSIS — C3430 Malignant neoplasm of lower lobe, unspecified bronchus or lung: Secondary | ICD-10-CM

## 2013-07-10 DIAGNOSIS — C7951 Secondary malignant neoplasm of bone: Secondary | ICD-10-CM

## 2013-07-10 MED ORDER — ERLOTINIB HCL 150 MG PO TABS: 150.0000 mg | ORAL_TABLET | Freq: Every day | ORAL | Status: DC

## 2013-07-10 MED ORDER — OXYCODONE-ACETAMINOPHEN 5-325 MG PO TABS: 1.0000 | ORAL_TABLET | Freq: Four times a day (QID) | ORAL | Status: DC | PRN

## 2013-07-10 NOTE — Patient Instructions (Signed)
CURRENT THERAPY:  1) Tarceva 150 mg by mouth daily.  2) Palliative radiotherapy to the lower thoracic and lumbar spine under the care of Dr. Michell Heinrich  DISEASE STAGE: Stage IV  CHEMOTHERAPY INTENT: Palliative  CURRENT # OF CHEMOTHERAPY CYCLES: 1  CURRENT ANTIEMETICS: Zofran and Compazine  CURRENT SMOKING STATUS: Former smoker, quit 05/11/2012  ORAL CHEMOTHERAPY AND CONSENT: Tarceva and consent was signed today.  CURRENT BISPHOSPHONATES USE: None  PAIN MANAGEMENT: 4/10  NARCOTICS INDUCED CONSTIPATION: None  LIVING WILL AND CODE STATUS: ?

## 2013-07-10 NOTE — Progress Notes (Signed)
[facility] [phone]   [phone]  OFFICE PROGRESS NOTE  Piedad Climes, PA-C [address] [place] [address]  DIAGNOSIS: Metastatic non-small cell lung cancer favoring adenocarcinoma with negative EGFR mutation, negative ALK gene translocation and negative ROS1 diagnosed in August of 2013. Veristrat test Good.  PRIOR THERAPY:  1) Status post 4 cycles of systemic chemotherapy with carboplatin and Alimta with partial response at [facility]. Last dose was given in November of 2013.  2) Maintenance chemotherapy with Alimta 500 mg/M2 started on 09/26/2012, status post 9 cycles. Last dose was given on 06/05/2013. Discontinued secondary to disease progression.   CURRENT THERAPY:   1) Tarceva 150 mg by mouth daily. 2) Palliative radiotherapy to the lower thoracic and lumbar spine under the care of Dr. Michell Heinrich   DISEASE STAGE: Stage IV  CHEMOTHERAPY INTENT: Palliative  CURRENT # OF CHEMOTHERAPY CYCLES: 1  CURRENT ANTIEMETICS: Zofran and Compazine  CURRENT SMOKING STATUS: Former smoker, quit 05/11/2012  ORAL CHEMOTHERAPY AND CONSENT: Tarceva and consent was signed today. CURRENT BISPHOSPHONATES USE: None  PAIN MANAGEMENT: 4/10  NARCOTICS INDUCED CONSTIPATION: None  LIVING WILL AND CODE STATUS: ?   INTERVAL HISTORY: Thomas May 67 y.o. male returns to the clinic today for followup visit accompanied by his daughter and his [place] interpreter. The patient is currently undergoing palliative radiotherapy under the care of Dr. Michell Heinrich to the lower lumbar spines. He is tolerating his radiotherapy fairly well and his pain is currently down to 4/10 mainly in the right hip area. He is currently on OxyContin 20 mg by mouth every 12 hours in addition to Percocet 1-2 tablets every 6 hours as needed. The patient denied having any other significant complaints except for fatigue. He denied having any chest pain, shortness breath, cough  or hemoptysis. He has no nausea or vomiting. He has a Art therapist test performed recently and the final result was good. He is here today for evaluation and discussion of his treatment options.  MEDICAL HISTORY: Past Medical History  Diagnosis Date  . Tobacco use   . Lung mass   . BPH (benign prostatic hyperplasia)   . Lung cancer     ALLERGIES:  has No Known Allergies.  MEDICATIONS:  Current Outpatient Prescriptions  Medication Sig Dispense Refill  . Multiple Vitamin (MULTIVITAMIN) tablet Take 1 tablet by mouth daily.      Marland Kitchen oxyCODONE (OXYCONTIN) 20 MG 12 hr tablet Take 1 tablet (20 mg total) by mouth every 12 (twelve) hours.  60 tablet  0  . oxyCODONE-acetaminophen (PERCOCET/ROXICET) 5-325 MG per tablet Take 1 tablet by mouth every 6 (six) hours as needed for pain. May take 2 tablets PO q 6 hours for severe pain - Do not take with Tylenol as this tablet already contains tylenol  25 tablet  0  . prochlorperazine (COMPAZINE) 10 MG tablet Take 5 mg by mouth every 6 (six) hours as needed. Take 0.5 tablets (5 mg total) by mouth every 6 (six) hours as needed for Nausea.      . Saw Palmetto, Serenoa repens, (SAW PALMETTO PO) Take 1 capsule by mouth daily.      Marland Kitchen terazosin (HYTRIN) 2 MG capsule Take 4 mg by mouth at bedtime.      . lidocaine-prilocaine (EMLA) cream Apply topically as needed. Cover with plastic wrap 1.5 hours before chemo.  30 g  1  . polyethylene glycol (MIRALAX / GLYCOLAX) packet Take 17 g by mouth  daily.       No current facility-administered medications for this visit.    SURGICAL HISTORY:  Past Surgical History  Procedure Laterality Date  . Video bronchoscopy  05/31/2012    Procedure: VIDEO BRONCHOSCOPY WITH FLUORO;  Surgeon: Nyoka Cowden, MD;  Location: [facility];  Service: Endoscopy;  Laterality: Bilateral;    REVIEW OF SYSTEMS:  Constitutional: positive for fatigue Eyes: negative Ears, nose, mouth, throat, and face: negative Respiratory:  negative Cardiovascular: negative Gastrointestinal: negative Genitourinary:negative Integument/breast: negative Hematologic/lymphatic: negative Musculoskeletal:positive for bone pain and muscle weakness Neurological: negative Behavioral/Psych: negative Endocrine: negative Allergic/Immunologic: negative   PHYSICAL EXAMINATION: General appearance: alert, cooperative, fatigued and no distress Head: Normocephalic, without obvious abnormality, atraumatic Neck: no adenopathy, no JVD, supple, symmetrical, trachea midline and thyroid not enlarged, symmetric, no tenderness/mass/nodules Lymph nodes: Cervical, supraclavicular, and axillary nodes normal. Resp: clear to auscultation bilaterally Back: symmetric, no curvature. ROM normal. No CVA tenderness. Cardio: regular rate and rhythm, S1, S2 normal, no murmur, click, rub or gallop GI: soft, non-tender; bowel sounds normal; no masses,  no organomegaly Extremities: extremities normal, atraumatic, no cyanosis or edema Neurologic: Alert and oriented X 3, normal strength and tone. Normal symmetric reflexes. Normal coordination and gait  ECOG PERFORMANCE STATUS: 2 - Symptomatic, <50% confined to bed  Blood pressure 140/90, pulse 82, temperature 97.4 F (36.3 C), temperature source Oral, resp. rate 19, height 5\' 6"  (1.676 m), weight 134 lb 9.6 oz (61.054 kg), SpO2 98.00%.  LABORATORY DATA: Lab Results  Component Value Date   WBC 9.8 06/26/2013   HGB 13.5 06/26/2013   HCT 38.9 06/26/2013   MCV 93.3 06/26/2013   PLT 251 06/26/2013      Chemistry      Component Value Date/Time   NA 138 06/26/2013 1115   NA 137 06/18/2013 1712   K 3.8 06/26/2013 1115   K 3.6 06/18/2013 1712   CL 100 06/18/2013 1712   CL 109* 04/03/2013 1155   CO2 26 06/26/2013 1115   CO2 23 06/18/2013 1712   BUN 18.3 06/26/2013 1115   BUN 12 06/18/2013 1712   CREATININE 1.0 06/26/2013 1115   CREATININE 0.80 06/18/2013 1712   CREATININE 0.65 06/02/2012 1611      Component Value Date/Time    CALCIUM 10.1 06/26/2013 1115   CALCIUM 10.0 06/18/2013 1712   ALKPHOS 118 06/26/2013 1115   ALKPHOS 110 06/18/2013 1712   AST 29 06/26/2013 1115   AST 34 06/18/2013 1712   ALT 47 06/26/2013 1115   ALT 38 06/18/2013 1712   BILITOT 0.82 06/26/2013 1115   BILITOT 0.6 06/18/2013 1712       RADIOGRAPHIC STUDIES: Ct Abdomen Pelvis Wo Contrast  06/18/2013   *RADIOLOGY REPORT*  Clinical Data: Back pain.  Metastatic lung cancer.  CT ABDOMEN AND PELVIS WITHOUT CONTRAST  Technique:  Multidetector CT imaging of the abdomen and pelvis was performed following the standard protocol without intravenous contrast.  Comparison: CT of the abdomen and pelvis 04/23/2013.  Findings:  Lung Bases: Incompletely visualized right middle lobe mass measuring at least 3.2 x 2.0 cm appears increased in size compared to the prior study 04/23/2013.  A trace amount of pleural thickening and/or fluid bilaterally.  The tip of a central venous catheter in the superior aspect of the right atrium.  A small hiatal hernia.  Abdomen/Pelvis:  5 mm nonobstructive calculus in the upper pole collecting system of the left kidney.  No additional calculi within the right renal collecting system and or along  the course of either ureter.  No hydroureteronephrosis or perinephric stranding to suggest urinary tract obstruction at this time.  Several bladder calculi are noted, and there is a diverticulum extending off the posterolateral aspect of the urinary bladder on the left side which also contains calculi.  The largest of these calculi is within the left diverticulum measuring 21 mm in length.  The unenhanced appearance of the liver, gallbladder, pancreas, spleen and bilateral adrenal glands is unremarkable.  There is atherosclerosis throughout the abdominal and pelvic vasculature, without evidence of aneurysm.  No significant volume of ascites. No pneumoperitoneum.  No pathologic distension of small bowel.  No definite pathologic lymphadenopathy identified within  the abdomen or pelvis on today's noncontrast CT examination.  Normal appendix.  Musculoskeletal: Compared to the prior study there has been interval enlargement of numerous predominately lytic aggressive appearing lesions throughout the visualized axial and appendicular skeleton, compatible with progression of metastatic disease to the bones.  Several of these lesions in the spine appear to be associated with some pathologic compression, most notably involving the superior endplate of T12 posteriorly, the posterior aspect of L4 on the right side, where there is a small amount of a the soft tissue impinging upon the underlying spinal canal, and the posterolateral aspect of L5.  IMPRESSION: 1.  Progression of metastatic disease to the lungs and bones with interval development of pathologic compression fractures at T12, L4- L5, as above. 2.  5 mm nonobstructive calculus in the left renal collecting system and multiple bladder calculi redemonstrated, as above. 3.  Extensive atherosclerosis. 4.  Additional incidental findings, as above.   Original Report Authenticated By: Trudie Reed, M.D.   Dg Lumbar Spine Complete  06/18/2013   *RADIOLOGY REPORT*  Clinical Data: Pain.  LUMBAR SPINE - COMPLETE 4+ VIEW  Comparison: 04/23/2013  Findings: The bones are diffusely osteopenic.  Normal alignment of the lumbar spine.  The vertebral body heights are well preserved. There is multilevel disc space narrowing and ventral endplate spurring consistent with spondylosis.  Facet hypertrophy and degenerative changes also noted.  There is a calcified atherosclerotic disease affecting the abdominal aorta. Stone within the upper pole of the left kidney measures 3 mm.  Bladder calculi are again identified including stones within the bladder diverticula.  IMPRESSION:  1.  Osteopenia and mild spondylosis.   Original Report Authenticated By: Signa Kell, M.D.   Dg Hip Complete Right  06/18/2013   *RADIOLOGY REPORT*  Clinical Data: Back  pain.  RIGHT HIP - COMPLETE 2+ VIEW  Comparison: None  Findings: There is no evidence of fracture or dislocation.  There is no evidence of arthropathy or other focal bone abnormality. Soft tissues are unremarkable.  IMPRESSION: Negative exam.   Original Report Authenticated By: Signa Kell, M.D.   Ct Chest W Contrast  06/22/2013   CLINICAL DATA:  History of lung cancer diagnosed in August 2013. Chemotherapy in progress.  EXAM: CT CHEST WITH CONTRAST  TECHNIQUE: Multidetector CT imaging of the chest was performed during intravenous contrast administration.  CONTRAST:  100 OMNIPAQUE IOHEXOL 300 MG/ML SOLN, 80mL OMNIPAQUE IOHEXOL 300 MG/ML SOLN  COMPARISON:  CT of the chest, abdomen and pelvis 04/23/2013.  FINDINGS: Mediastinum: Heart size is mildly enlarged. There is no significant pericardial fluid, thickening or pericardial calcification. There is atherosclerosis of the thoracic aorta, the great vessels of the mediastinum and the coronary arteries, including calcified atherosclerotic plaque in the left main, left anterior descending and left circumflex coronary arteries. No pathologically enlarged mediastinal or  hilar lymph nodes. Esophagus is unremarkable in appearance. Right internal jugular single-lumen porta cath with tip terminating at the superior cavoatrial junction.  Lungs/Pleura: Compared to the prior study, the previously noted right middle lobe pulmonary nodule has significantly increased in size, currently a 3.3 x 2.0 cm mass with macrolobulated and spiculated margins. Additionally, the previously noted satellite nodules have also increased, largest of which measures 1.1 cm in diameter (image 31 of series 5). Surrounding postobstructive changes are noted in the adjacent lung parenchyma in the right middle lobe. In addition, there is a new peripheral nodular opacity in the lateral aspect of the right lower lobe measuring approximately 1.2 x 1.0 cm (image 22 of series 5). Previously noted 8 mm right  upper lobe nodule (image 16 of series 5) and a right upper lobe pulmonary nodule measuring 4 mm (image 13 of series 5) are both unchanged. Small amount of pleural thickening in the thorax bilaterally. No frank pleural effusions.  Upper Abdomen: Mild intrahepatic biliary ductal dilatation. 11 mm low-attenuation lesion in segment 8 of the liver is unchanged. 5 mm nonobstructive calculus in the upper pole collecting system of the left kidney is unchanged.  Musculoskeletal: In the posterolateral aspect of the left 1st rib and lateral aspect of the left 3rd rib there are lytic lesions which have a "moth-eaten" appearance, compatible with metastatic lesions. In addition, there is a mixed lytic and sclerotic lesion in T12 vertebral body posteriorly which has enlarged compared to the prior examination. Additionally, there is a lytic lesion involving the lamina of T12 on the left.  IMPRESSION: 1. Progression of disease as demonstrated by enlargement of the primary right middle lobe mass, increased size of satellite nodules in the right middle lobe, new suspicious nodule in the right lower lobe, and progression of metastatic disease to the bones, as detailed above. 2. Cardiomegaly with left main and 2 vessel coronary artery disease. 3. Additional incidental findings, as above, similar prior studies.   Electronically Signed   By: Trudie Reed M.D.   On: 06/22/2013 17:01   Mr Thoracic Spine W Wo Contrast  07/03/2013   CLINICAL DATA:  Metastatic lung cancer.  EXAM: MRI THORACIC AND LUMBAR SPINE WITHOUT AND WITH CONTRAST  TECHNIQUE: Multiplanar and multiecho pulse sequences of the thoracic and lumbar spine were obtained without and with intravenous contrast.  CONTRAST:  12mL MULTIHANCE GADOBENATE DIMEGLUMINE 529 MG/ML IV SOLN  COMPARISON:  None.  CT abdomen pelvis June 18, 2013  FINDINGS: MR THORACIC SPINE FINDINGS  Multiple metastatic deposits in the thoracic spine. Mild pathologic fracture T12. No other fracture is  identified. No cord compression is identified.  Tumor in the posterior elements of T2. Paraspinous enhancing tissue may represent tumor. No tumor in the canal.  Tumor in the right 5 rib and adjacent soft tissues  Tumor in the left 7th rib in adjacent soft tissues.  Tumor in the left pedicle of T11  Tumor throughout the T12 vertebral body with a mild pathologic fracture. There is mild ventral epidural tumor. There is tumor in the posterior elements on the left with mild posterior epidural tumor. No cord compression.  MR LUMBAR SPINE FINDINGS  Widespread metastatic disease in the lumbar spine. There is tumor at S1. There is tumor in the iliac bones bilaterally. Pathologic fracture T12 as described above. Mild pathologic fracture of L4 and L5.  Conus medullaris terminates at mid L1. No compression of the conus. There is mild spinal stenosis at T12 due to epidural tumor.  Tumor in the super endplate of L2.  Tumor throughout the L4 vertebral body extending into the right pedicle. Mild pathologic fracture superior endplate. There is epidural tumor on the right extending into the right foramen and lateral recess. This could cause impingement of the right L4 and L5 nerve roots.  Mild pathologic fracture L5. There is predominantly left-sided tumor extending into the pedicle with a mild amount of surrounding epidural tumor. Bilateral facet hypertrophy and mild spinal stenosis.  IMPRESSION: MR THORACIC SPINE IMPRESSION  Multiple areas of metastatic disease in the thoracic spine. Mild pathologic fracture T12. Negative for cord compression.  MR LUMBAR SPINE IMPRESSION  Diffuse metastatic disease in the lumbar spine and sacral spine and iliac bones bilaterally.  Mild pathologic fracture of L4 with epidural tumor on the right causing right foraminal encroachment and possible impingement of the right L5 nerve root.  Mild pathologic fracture of L5 on the left side.   Electronically Signed   By: Marlan Palau M.D.   On: 07/03/2013  18:37   Mr Lumbar Spine W Wo Contrast  07/03/2013   CLINICAL DATA:  Metastatic lung cancer.  EXAM: MRI THORACIC AND LUMBAR SPINE WITHOUT AND WITH CONTRAST  TECHNIQUE: Multiplanar and multiecho pulse sequences of the thoracic and lumbar spine were obtained without and with intravenous contrast.  CONTRAST:  12mL MULTIHANCE GADOBENATE DIMEGLUMINE 529 MG/ML IV SOLN  COMPARISON:  None.  CT abdomen pelvis June 18, 2013  FINDINGS: MR THORACIC SPINE FINDINGS  Multiple metastatic deposits in the thoracic spine. Mild pathologic fracture T12. No other fracture is identified. No cord compression is identified.  Tumor in the posterior elements of T2. Paraspinous enhancing tissue may represent tumor. No tumor in the canal.  Tumor in the right 5 rib and adjacent soft tissues  Tumor in the left 7th rib in adjacent soft tissues.  Tumor in the left pedicle of T11  Tumor throughout the T12 vertebral body with a mild pathologic fracture. There is mild ventral epidural tumor. There is tumor in the posterior elements on the left with mild posterior epidural tumor. No cord compression.  MR LUMBAR SPINE FINDINGS  Widespread metastatic disease in the lumbar spine. There is tumor at S1. There is tumor in the iliac bones bilaterally. Pathologic fracture T12 as described above. Mild pathologic fracture of L4 and L5.  Conus medullaris terminates at mid L1. No compression of the conus. There is mild spinal stenosis at T12 due to epidural tumor.  Tumor in the super endplate of L2.  Tumor throughout the L4 vertebral body extending into the right pedicle. Mild pathologic fracture superior endplate. There is epidural tumor on the right extending into the right foramen and lateral recess. This could cause impingement of the right L4 and L5 nerve roots.  Mild pathologic fracture L5. There is predominantly left-sided tumor extending into the pedicle with a mild amount of surrounding epidural tumor. Bilateral facet hypertrophy and mild spinal  stenosis.  IMPRESSION: MR THORACIC SPINE IMPRESSION  Multiple areas of metastatic disease in the thoracic spine. Mild pathologic fracture T12. Negative for cord compression.  MR LUMBAR SPINE IMPRESSION  Diffuse metastatic disease in the lumbar spine and sacral spine and iliac bones bilaterally.  Mild pathologic fracture of L4 with epidural tumor on the right causing right foraminal encroachment and possible impingement of the right L5 nerve root.  Mild pathologic fracture of L5 on the left side.   Electronically Signed   By: Marlan Palau M.D.   On: 07/03/2013 18:37  Mr Hip Right W Wo Contrast  07/03/2013   CLINICAL DATA:  Low back pain and severe or spread that low back pain and right hip pain. Metastatic lung cancer.  EXAM: MRI OF THE RIGHT HIP WITHOUT AND WITH CONTRAST  TECHNIQUE: Multiplanar, multisequence MR imaging was performed both before and after administration of intravenous contrast.  CONTRAST:  12 cc MultiHance  COMPARISON:  CT scan dated 06/18/2013  FINDINGS: Lower lumbar metastatic lesions noted with impingement as described on lumbar MRI from 07/03/2013.  Osseous metastatic disease to the pelvis includes a 2.1 x 1.7 cm right iliac bone lesion on image 5 of series 4, a 2.0 x 1.2 cm right iliac bone lesion with posterior cortical irregularity, suspected extraosseous extension, and adjacent edema and enhancement in the gluteus minimus muscle on image 11 of series 4 ; and scattered additional lesions of both iliac bones, the upper sacrum, and the right ischial tuberosity.  The ischial tuberosity lesion partially undermines the hamstring origination site (especially of the adductor magnus) and is associated with edema along the quadratus femoris on the right. No sciatic notch impingement or abnormal sciatic nerve asymmetry noted.  No proximal femoral metastatic lesions observed.  There are several filling defects in the urinary bladder compatible with bladder stones as shown previously. Median lobe  of the prostate indents the bladder base.  IMPRESSION: 1. Multiple osseous pelvic metastatic lesions. One of the right iliac lesions has extraosseous extension along the right gluteus minimus which also demonstrates enhancement and edema. The right ischial tuberosity lesion undermines the adductor magnus origination site and is associated with extraosseous extension and edema along the right quadratus femorals muscle. 2. Lower lumbar spine metastatic lesions with extraosseous extension likely causing impingement -please see report from lumbar spine MRI. 3. Bladder stones.   Electronically Signed   By: Herbie Baltimore   On: 07/03/2013 18:56    ASSESSMENT AND PLAN: This is a very pleasant 67 years old [place] male with metastatic non-small cell lung cancer, adenocarcinoma status post systemic chemotherapy with carboplatin and Alimta followed by 9 cycles of maintenance chemotherapy with single agent Alimta but unfortunately has evidence for disease progression with multiple bone metastases as well as progression of the disease in his lung. The patient is currently undergoing palliative radiotherapy under the care of Dr. Michell Heinrich. He completed 5 out of 12 fractions of radiation. His Veristrat test was good. I discussed with the patient treatment with oral Tarceva 150 mg by mouth daily. I discussed with the patient adverse effect of this treatment including but not limited to skin rash, diarrhea, interstitial lung disease, liver or renal dysfunction. The patient would like to proceed with the treatment. I will send his prescription to [facility] pharmacy. I would also like to consider the patient for treatment with Rivka Barbara because of his multiple bone metastasis and I would refer him to Dr. Kristin Bruins for dental evaluation before the treatment. He would come back for followup visit in 2 weeks for evaluation and management any adverse effect of his treatment. For pain management the patient will continue  on treatment with OxyContin 20 mg by mouth every 12 hours in addition to Percocet 5/325 mg by mouth every 6 hours as needed. He was given a refill of Percocet today. The patient was advised to call immediately if he has any concerning symptoms in the interval. The patient voices understanding of current disease status and treatment options and is in agreement with the current care plan.  All questions  were answered. The patient knows to call the clinic with any problems, questions or concerns. We can certainly see the patient much sooner if necessary.  I spent 20 minutes counseling the patient face to face. The total time spent in the appointment was 30 minutes.

## 2013-07-10 NOTE — Progress Notes (Signed)
[facility]    Radiation Oncology [address] [place]     Maryln Gottron, M.D. [place], [place] [address]               Billie Lade, M.D., Ph.D. Phone: [phone]      Molli Hazard A. Kathrynn Running, M.D. Fax: [phone]      Radene Gunning, M.D., Ph.D.         Lurline Hare, M.D.         Grayland Jack, M.D Weekly Treatment Management Note  Name: Thomas May     MRN: [id number]        CSN: [id number] Date: 07/10/2013      DOB: [birth year]  CC: Piedad Climes, PA-C         Daphine Deutscher    Status: Outpatient  Diagnosis: The encounter diagnosis was Primary malignant neoplasm of lung metastatic to other site, unspecified laterality.  Current Dose: 12.5 Gy  Current Fraction: 5  Planned Dose: 30 Gy  Narrative: Thomas May was seen today for weekly treatment management. The chart was checked and port films  were reviewed. He is having some nausea but had this prior to starting his radiation therapy. Patient does take Compazine but only once a day and I recommended he take this every 6 hours if he has nausea.  His bowels are normal at this time. His pain is slightly improved but he has had some pain radiating into the right leg occasionally.  Review of patient's allergies indicates no known allergies.  Current Outpatient Prescriptions  Medication Sig Dispense Refill  . dexamethasone (DECADRON) 4 MG tablet TAKE 1 TABLET BY MOUTH TWICE A DAY DAY BEFORE DAY OF AND DAY AFTER THE CHEMO THEREAPY EVERY 3 WEEKS  40 tablet  0  . docusate sodium (COLACE) 100 MG capsule Take 100 mg by mouth 2 (two) times daily.      . folic acid (FOLVITE) 1 MG tablet Take 1 tablet (1 mg total) by mouth daily.  30 tablet  2  . lidocaine-prilocaine (EMLA) cream Apply topically as needed. Cover with plastic wrap 1.5 hours before chemo.  30 g  1  . Multiple Vitamin (MULTIVITAMIN) tablet Take 1 tablet by mouth daily.      Marland Kitchen oxyCODONE (OXYCONTIN) 20 MG 12 hr tablet Take 1 tablet (20 mg total) by mouth every 12  (twelve) hours.  60 tablet  0  . oxyCODONE-acetaminophen (PERCOCET/ROXICET) 5-325 MG per tablet Take 1 tablet by mouth every 6 (six) hours as needed for pain. May take 2 tablets PO q 6 hours for severe pain - Do not take with Tylenol as this tablet already contains tylenol  25 tablet  0  . polyethylene glycol (MIRALAX / GLYCOLAX) packet Take 17 g by mouth daily.      . prochlorperazine (COMPAZINE) 10 MG tablet Take 5 mg by mouth every 6 (six) hours as needed. Take 0.5 tablets (5 mg total) by mouth every 6 (six) hours as needed for Nausea.      . Saw Palmetto, Serenoa repens, (SAW PALMETTO PO) Take 1 capsule by mouth daily.      Marland Kitchen terazosin (HYTRIN) 2 MG capsule Take 4 mg by mouth at bedtime.       No current facility-administered medications for this encounter.   Labs:  Lab Results  Component Value Date   WBC 9.8 06/26/2013   HGB 13.5 06/26/2013   HCT 38.9 06/26/2013   MCV 93.3 06/26/2013   PLT 251 06/26/2013  Lab Results  Component Value Date   CREATININE 1.0 06/26/2013   BUN 18.3 06/26/2013   NA 138 06/26/2013   K 3.8 06/26/2013   CL 100 06/18/2013   CO2 26 06/26/2013   Lab Results  Component Value Date   ALT 47 06/26/2013   AST 29 06/26/2013   BILITOT 0.82 06/26/2013    Physical Examination:  weight is 135 lb (61.236 kg). His temperature is 98 F (36.7 C). His blood pressure is 134/87 and his pulse is 81. His oxygen saturation is 99%.    Wt Readings from Last 3 Encounters:  07/10/13 135 lb (61.236 kg)  06/28/13 139 lb 8 oz (63.277 kg)  06/27/13 138 lb 8 oz (62.823 kg)     Lungs - Normal respiratory effort, chest expands symmetrically. Lungs are clear to auscultation, no crackles or wheezes.  Heart has regular rhythm and rate  Abdomen is soft and non tender with normal bowel sounds He is being transported by wheelchair. Lower motor strength is symmetric and good.  Assessment:  Patient tolerating treatments well  Plan: Continue treatment per original radiation prescription

## 2013-07-10 NOTE — Progress Notes (Signed)
Patient here for weekly assessment.Completed 5 of 12 treatments to spine.There is some improvement in pain."4" on 0 to 10 .Denies fatigue.No other problems voiced.Appetite improved.Weight up 3 lbs.

## 2013-07-11 ENCOUNTER — Ambulatory Visit
Admission: RE | Admit: 2013-07-11 | Discharge: 2013-07-11 | Disposition: A | Discharge status: Home / Self Care | Payer: BC Managed Care – PPO | Source: Ambulatory Visit | Attending: Radiation Oncology | Admitting: Radiation Oncology

## 2013-07-12 ENCOUNTER — Telehealth: Payer: Self-pay | Admitting: Medical Oncology

## 2013-07-12 ENCOUNTER — Telehealth: Payer: Self-pay | Admitting: Internal Medicine

## 2013-07-12 ENCOUNTER — Encounter: Payer: Self-pay | Admitting: Radiation Oncology

## 2013-07-12 ENCOUNTER — Ambulatory Visit
Admission: RE | Admit: 2013-07-12 | Discharge: 2013-07-12 | Disposition: A | Discharge status: Home / Self Care | Payer: BC Managed Care – PPO | Source: Ambulatory Visit | Attending: Radiation Oncology | Admitting: Radiation Oncology

## 2013-07-12 DIAGNOSIS — C3491 Malignant neoplasm of unspecified part of right bronchus or lung: Secondary | ICD-10-CM

## 2013-07-12 DIAGNOSIS — M8430XS Stress fracture, unspecified site, sequela: Secondary | ICD-10-CM

## 2013-07-12 MED ORDER — ERLOTINIB HCL 150 MG PO TABS: 150.0000 mg | ORAL_TABLET | Freq: Every day | ORAL | Status: DC

## 2013-07-12 NOTE — Telephone Encounter (Signed)
Daughter called and said will cannot fill rx. I called in rx to [facility] and he  will send to their specialty pharmacy in [place].

## 2013-07-12 NOTE — Telephone Encounter (Signed)
interpreter [phone] lvm for pt regarding to 10.15.14 appt.

## 2013-07-13 ENCOUNTER — Ambulatory Visit
Admission: RE | Admit: 2013-07-13 | Discharge: 2013-07-13 | Disposition: A | Discharge status: Home / Self Care | Payer: BC Managed Care – PPO | Source: Ambulatory Visit | Attending: Radiation Oncology | Admitting: Radiation Oncology

## 2013-07-16 ENCOUNTER — Encounter: Payer: Self-pay | Admitting: Internal Medicine

## 2013-07-16 ENCOUNTER — Ambulatory Visit
Admission: RE | Admit: 2013-07-16 | Discharge: 2013-07-16 | Disposition: A | Discharge status: Home / Self Care | Payer: BC Managed Care – PPO | Source: Ambulatory Visit | Attending: Radiation Oncology | Admitting: Radiation Oncology

## 2013-07-16 ENCOUNTER — Other Ambulatory Visit: Payer: Self-pay | Admitting: *Deleted

## 2013-07-16 DIAGNOSIS — M8430XS Stress fracture, unspecified site, sequela: Secondary | ICD-10-CM

## 2013-07-16 DIAGNOSIS — C3491 Malignant neoplasm of unspecified part of right bronchus or lung: Secondary | ICD-10-CM

## 2013-07-16 MED ORDER — ERLOTINIB HCL 150 MG PO TABS: 150.0000 mg | ORAL_TABLET | Freq: Every day | ORAL | Status: DC

## 2013-07-16 NOTE — Telephone Encounter (Signed)
Rx called into [facility] per pt's daughter's request. [phone].  SLJ

## 2013-07-17 ENCOUNTER — Ambulatory Visit
Admission: RE | Admit: 2013-07-17 | Discharge: 2013-07-17 | Disposition: A | Discharge status: Home / Self Care | Payer: BC Managed Care – PPO | Source: Ambulatory Visit | Attending: Radiation Oncology | Admitting: Radiation Oncology

## 2013-07-17 VITALS — BP 130/86 | HR 83 | Temp 98.5°F | Wt 132.6 lb

## 2013-07-17 DIAGNOSIS — C3490 Malignant neoplasm of unspecified part of unspecified bronchus or lung: Secondary | ICD-10-CM

## 2013-07-17 NOTE — Progress Notes (Signed)
Weekly Management Note Current Dose: 25 Gy  Projected Dose: 30 Gy   Narrative:  The patient presents for routine under treatment assessment.  CBCT/MVCT images/Port film x-rays were reviewed.  The chart was checked. Ambulating up stairs per daughter which may exacerbate pain. Current level is a 5. Did not sleep last night due to pain.   Physical Findings: Weight: 132 lb 9.6 oz (60.147 kg). Unchanged. Alert and oriented.  Impression:  The patient is tolerating radiation.  Plan:  Continue treatment as planned. Follow up next week with med onc and in 1 month with me.

## 2013-07-17 NOTE — Progress Notes (Signed)
Patient has completed 10 of 12 treatments to L3-S3 spine.Pain about the same but patient able to ambulate up the stairs and walk to bathroom.Appetite diminished.To see Dr.Kulinski tomorrow.

## 2013-07-18 ENCOUNTER — Encounter (HOSPITAL_COMMUNITY): Payer: Self-pay | Admitting: Dentistry

## 2013-07-18 ENCOUNTER — Ambulatory Visit
Admission: RE | Admit: 2013-07-18 | Discharge: 2013-07-18 | Disposition: A | Discharge status: Home / Self Care | Payer: BC Managed Care – PPO | Source: Ambulatory Visit | Attending: Radiation Oncology | Admitting: Radiation Oncology

## 2013-07-18 ENCOUNTER — Ambulatory Visit (HOSPITAL_COMMUNITY): Payer: Self-pay | Admitting: Dentistry

## 2013-07-18 ENCOUNTER — Other Ambulatory Visit (HOSPITAL_COMMUNITY): Payer: BC Managed Care – PPO | Admitting: Dentistry

## 2013-07-18 VITALS — BP 131/85 | HR 75 | Temp 98.1°F

## 2013-07-18 DIAGNOSIS — K0530 Chronic periodontitis, unspecified: Secondary | ICD-10-CM

## 2013-07-18 DIAGNOSIS — K029 Dental caries, unspecified: Secondary | ICD-10-CM

## 2013-07-18 DIAGNOSIS — C3490 Malignant neoplasm of unspecified part of unspecified bronchus or lung: Secondary | ICD-10-CM

## 2013-07-18 DIAGNOSIS — K036 Deposits [accretions] on teeth: Secondary | ICD-10-CM

## 2013-07-18 DIAGNOSIS — K08409 Partial loss of teeth, unspecified cause, unspecified class: Secondary | ICD-10-CM

## 2013-07-18 DIAGNOSIS — Z0189 Encounter for other specified special examinations: Secondary | ICD-10-CM

## 2013-07-18 DIAGNOSIS — IMO0002 Reserved for concepts with insufficient information to code with codable children: Secondary | ICD-10-CM

## 2013-07-18 DIAGNOSIS — K0889 Other specified disorders of teeth and supporting structures: Secondary | ICD-10-CM

## 2013-07-18 DIAGNOSIS — C801 Malignant (primary) neoplasm, unspecified: Secondary | ICD-10-CM

## 2013-07-18 DIAGNOSIS — K0820 Unspecified atrophy of edentulous alveolar ridge: Secondary | ICD-10-CM

## 2013-07-18 NOTE — Progress Notes (Signed)
DENTAL CONSULTATION  Date of Consultation:  07/18/2013 Patient Name:   Thomas May Date of Birth:   Feb 16, 1946 Medical Record Number: [id number]  VITALS: BP 131/85  Pulse 75  Temp(Src) 98.1 F (36.7 C) (Oral)  CHIEF COMPLAINT: Patient was referred for a pre-Xgeva dental protocol evaluation.  HPI: Thomas May is a 67 year old [place] male referred by Dr. Arbutus Ped for a dental consultation. Patient with recent diagnosis of metastatic lung cancer. Patient with anticipated use of Xgeva. Patient is now seen as part of a pre-Xgeva dental protocol evaluation to rule out dental disease and to discuss potential risk for osteonecrosis of the jaw related to the anticipated Xgeva therapy.  Patient currently denies acute toothache, swellings, or abscesses. Patient knows that he has" a hole in his lower left tooth".  The patient was last seen in 2006 to have an upper complete denture fabricated the patient denies any problems with the upper denture. Patient was previously seen to have a 6 unit bridge from 22 through 27 inserted approximately 15 years ago while in [place].  Patient currently does not have a regular primary dentist. Patient is not seek regular dental care.   PROBLEM LIST: Patient Active Problem List   Diagnosis Date Noted  . Primary malignant neoplasm of lung metastatic to other site 07/10/2012    Priority: High  . Preventive measure 05/28/2013  . Lipoma of back 05/28/2013  . CAD (coronary artery disease) 07/10/2012  . Constipation 07/10/2012  . BPH (benign prostatic hyperplasia) 05/23/2012  . Lung mass 05/23/2012  . Shoulder pain, left 05/20/2012  . Tobacco use 05/20/2012   PMH: Past Medical History  Diagnosis Date  . Tobacco use   . Lung mass   . BPH (benign prostatic hyperplasia)   . Lung cancer     PSH: Past Surgical History  Procedure Laterality Date  . Video bronchoscopy  05/31/2012    Procedure: VIDEO BRONCHOSCOPY WITH FLUORO;  Surgeon: Nyoka Cowden, MD;  Location:  [facility];  Service: Endoscopy;  Laterality: Bilateral;    ALLERGIES: No Known Allergies  MEDICATIONS: Current Outpatient Prescriptions  Medication Sig Dispense Refill  . erlotinib (TARCEVA) 150 MG tablet Take 1 tablet (150 mg total) by mouth daily. Take on an empty stomach 1 hour before meals or 2 hours after.  30 tablet  2  . lidocaine-prilocaine (EMLA) cream Apply topically as needed. Cover with plastic wrap 1.5 hours before chemo.  30 g  1  . Multiple Vitamin (MULTIVITAMIN) tablet Take 1 tablet by mouth daily.      Marland Kitchen oxyCODONE (OXYCONTIN) 20 MG 12 hr tablet Take 1 tablet (20 mg total) by mouth every 12 (twelve) hours.  60 tablet  0  . oxyCODONE-acetaminophen (PERCOCET/ROXICET) 5-325 MG per tablet Take 1 tablet by mouth every 6 (six) hours as needed for pain. May take 2 tablets PO q 6 hours for severe pain - Do not take with Tylenol as this tablet already contains tylenol  60 tablet  0  . polyethylene glycol (MIRALAX / GLYCOLAX) packet Take 17 g by mouth daily.      . prochlorperazine (COMPAZINE) 10 MG tablet Take 5 mg by mouth every 6 (six) hours as needed. Take 0.5 tablets (5 mg total) by mouth every 6 (six) hours as needed for Nausea.      . Saw Palmetto, Serenoa repens, (SAW PALMETTO PO) Take 1 capsule by mouth daily.      Marland Kitchen terazosin (HYTRIN) 2 MG capsule Take 4 mg by mouth at bedtime.  No current facility-administered medications for this visit.    LABS: Lab Results  Component Value Date   WBC 9.8 06/26/2013   HGB 13.5 06/26/2013   HCT 38.9 06/26/2013   MCV 93.3 06/26/2013   PLT 251 06/26/2013      Component Value Date/Time   NA 138 06/26/2013 1115   NA 137 06/18/2013 1712   K 3.8 06/26/2013 1115   K 3.6 06/18/2013 1712   CL 100 06/18/2013 1712   CL 109* 04/03/2013 1155   CO2 26 06/26/2013 1115   CO2 23 06/18/2013 1712   GLUCOSE 114 06/26/2013 1115   GLUCOSE 130* 06/18/2013 1712   GLUCOSE 168* 04/03/2013 1155   BUN 18.3 06/26/2013 1115   BUN 12 06/18/2013 1712   CREATININE 1.0  06/26/2013 1115   CREATININE 0.80 06/18/2013 1712   CREATININE 0.65 06/02/2012 1611   CALCIUM 10.1 06/26/2013 1115   CALCIUM 10.0 06/18/2013 1712   GFRNONAA >90 06/18/2013 1712   GFRAA >90 06/18/2013 1712   Lab Results  Component Value Date   INR 0.90 02/23/2013   INR 1.04 06/02/2012   No results found for this basename: PTT    SOCIAL HISTORY: History   Social History  . Marital Status: Married    Spouse Name: N/A    Number of Children: 2  . Years of Education: N/A   Occupational History  . Dry Cleaner Owner    Social History Main Topics  . Smoking status: Former Smoker -- 0.50 packs/day for 40 years    Types: Cigarettes    Quit date: 05/11/2012  . Smokeless tobacco: Never Used     Comment: Patient is using Nicotine patches.  . Alcohol Use: 1.5 oz/week    3 drink(s) per week     Comment: three times weekly  . Drug Use: No  . Sexual Activity: Not on file   Other Topics Concern  . Not on file   Social History Narrative   Occupation: Dry Cleaning   Married 34 years   1 son   1 daughter    FAMILY HISTORY: Family History  Problem Relation Age of Onset  . Ulcers Other   . Coronary artery disease Neg Hx    REVIEW OF SYSTEMS: Reviewed from chart for this admission.  DENTAL HISTORY: CHIEF COMPLAINT: Patient was referred for a pre-Xgeva dental protocol evaluation.  HPI: Thomas May is a 67 year old [place] male referred by Dr. Arbutus Ped for a dental consultation. Patient with diagnosis of metastatic lung cancer. Patient with anticipated use of Xgeva. Patient is now seen as part of a pre-Xgeva dental protocol evaluation to rule out dental disease and to discuss potential risk for osteonecrosis of the jaw related to the anticipated Xgeva therapy.  Patient currently denies acute toothache, swellings, or abscesses. Patient knows that he has" a hole in his lower left tooth".  The patient was last seen in 2006 to have an upper complete denture fabricated. The patient denies any  problems with the upper denture. Patient was previously seen to have a 6 unit bridge from 22 through 27 inserted approximately 15 years ago while in [place].  Patient currently does not have a regular primary dentist. Patient does not seek regular dental care.  DENTAL EXAMINATION:  GENERAL: The patient is a well-developed, well-nourished [place] male in no acute distress. HEAD AND NECK: There is no palpable submandibular lymphadenopathy. Patient denies acute TMJ symptoms. INTRAORAL EXAM: The patient has normal saliva. There is atrophy of the maxillary edentulous alveolar ridge.  DENTITION: The patient is missing tooth numbers 1 through 16, 17, 23, 24, 25, 26, and 32. PERIODONTAL: The patient has chronic, advanced periodontal disease with plaque and calculus collations, generalized interval recession and generalized tooth mobility of the remaining dentition. There is moderate to severe bone loss noted. DENTAL CARIES/SUBOPTIMAL RESTORATIONS: Patient has dental caries affecting tooth numbers 18 and 19 at this time. ENDODONTIC: Patient currently denies acute pulpitis symptoms. The patient may have incipient periapical pathology associate with tooth numbers 18 and 19. CROWN AND BRIDGE: The patient has a bridge from tooth numbers 22 through 27. This bridge has class I mobility secondary to bone loss associated with abutment teeth numbers 22 and 27. PROSTHODONTIC: The patient has an upper complete denture that is ill fitting. OCCLUSION: The occlusion of the upper complete denture and remaining lower dentition is acceptable.  RADIOGRAPHIC INTERPRETATION: An orthopantogram was obtained and supplemented with 6 lower periapical radiographs. There are multiple missing teeth numbers 1 through 16, 17, 23, 24, 25, 26, and 32. There are dental caries associated with tooth numbers 18 and 19. There is incipient periapical radiolucency and  pathology associated with tooth numbers 18 and 19. There is moderate to  severe bone loss noted. The maxilla is edentulous. There is atrophy of the edentulous maxilla.   ASSESSMENTS: 1. Chronic periodontitis with bone loss. 2. Plaque and calculus accumulations 3. Generalized gingival recession 4. Generalized tooth mobility 5.  Multiple missing teeth 6. Atrophy of the edentulous maxilla 7. Dental caries 8. Ill fitting maxillary complete denture 9. Bridge from tooth numbers 22 through 27 that shows class I mobility secondary to bone loss associated with tooth numbers 22 and 27  PLAN/RECOMMENDATIONS: 1. I discussed the risks, benefits, and complications of various treatment options with the patient, son, and interpreter in relationship to his medical and dental conditions, metastatic lung cancer, anticipated use of Xgeva with the potential risk for osteonecrosis of the jaw with future invasive dental procedures. We discussed various treatment options to include no treatment, extraction remaining teeth with alveoloplasty, pre-prosthetic surgery as indicated, periodontal therapy, dental restorations, root canal therapy, crown and bridge therapy, implant therapy, and replacement of missing teeth as indicated. The patient and son currently her considering extraction or all remaining teeth with alveoloplasty in the operating room followed by fabrication of a new upper lower complete denture after adequate healing. The patient and son, however, wish to discuss the plan of care with Dr. Gwenyth Bouillon next week at their appointment. The patient and his son will then contact dental medicine to arrange for her operating room procedure as indicated. Alternatively, the patient and son may consider referral to an oral surgeon if they so desire. Ideally, Xgeva therapy will be withheld for approximately 2-3 months after the dental extractions. The upper and lower complete dentures can then be fabricated by the dentist of her choice after adequate healing. The patient and son are aware of the  limited prognosis for successful denture use secondary to the severe atrophy of the maxillary edentulous alveolar ridge and the anticipated mandibular alveolar ridge after surgery.  2. Discussion of findings with medical team and coordination of future medical and dental care as needed.  Charlynne Pander, DDS

## 2013-07-18 NOTE — Patient Instructions (Signed)
The patient and son are to discuss plan for extraction of remaining teeth with Dr. Arbutus Ped at their appointment next week. Patient and son to then contact dental medicine to arrange for the operating room procedure as indicated. Alternatively, the patient can be referred to an oral surgeon for evaluation for the dental extractions with alveoloplasty. Dr. Kristin Bruins

## 2013-07-19 ENCOUNTER — Ambulatory Visit
Admission: RE | Admit: 2013-07-19 | Discharge: 2013-07-19 | Disposition: A | Discharge status: Home / Self Care | Payer: BC Managed Care – PPO | Source: Ambulatory Visit | Attending: Radiation Oncology | Admitting: Radiation Oncology

## 2013-07-19 ENCOUNTER — Encounter: Payer: Self-pay | Admitting: Radiation Oncology

## 2013-07-23 NOTE — Progress Notes (Signed)
  Radiation Oncology         [phone]) [phone] ________________________________  Name: Thomas May MRN: [id number]  Date: 07/19/2013  DOB: Feb 11, 1946  End of Treatment Note  Diagnosis:   Metastatic NSCLC with back and hip pain     Indication for treatment:  Palliation       Radiation treatment dates:   07/04/2013-07/19/2013  Site/dose:   Lumbosacral spine (L3-sacrum) to a dose of 30 Gy in 12 fractions at 2.5 Gy per fraction  Beam arrangement and beam energy: AP/PA with 10 and 18 MV photons  Narrative: The patient tolerated radiation treatment relatively well.   He had minimal improvement in his pain towards the end of treatment and no other side effects.  Plan: The patient has completed radiation treatment. The patient will return to radiation oncology clinic for routine followup in one month. I advised them to call or return sooner if they have any questions or concerns related to their recovery or treatment.  ------------------------------------------------  Lurline Hare, MD

## 2013-07-25 ENCOUNTER — Telehealth: Payer: Self-pay | Admitting: Internal Medicine

## 2013-07-25 ENCOUNTER — Other Ambulatory Visit (HOSPITAL_BASED_OUTPATIENT_CLINIC_OR_DEPARTMENT_OTHER): Payer: BC Managed Care – PPO | Admitting: Lab

## 2013-07-25 ENCOUNTER — Ambulatory Visit (HOSPITAL_BASED_OUTPATIENT_CLINIC_OR_DEPARTMENT_OTHER): Payer: BC Managed Care – PPO | Admitting: Physician Assistant

## 2013-07-25 VITALS — BP 120/82 | HR 76 | Temp 96.8°F | Resp 20 | Ht 66.0 in | Wt 131.1 lb

## 2013-07-25 DIAGNOSIS — C7951 Secondary malignant neoplasm of bone: Secondary | ICD-10-CM

## 2013-07-25 DIAGNOSIS — C801 Malignant (primary) neoplasm, unspecified: Secondary | ICD-10-CM

## 2013-07-25 DIAGNOSIS — M8430XS Stress fracture, unspecified site, sequela: Secondary | ICD-10-CM

## 2013-07-25 DIAGNOSIS — C3491 Malignant neoplasm of unspecified part of right bronchus or lung: Secondary | ICD-10-CM

## 2013-07-25 DIAGNOSIS — C3490 Malignant neoplasm of unspecified part of unspecified bronchus or lung: Secondary | ICD-10-CM

## 2013-07-25 LAB — CBC WITH DIFFERENTIAL/PLATELET
Basophils Absolute: 0 10*3/uL (ref 0.0–0.1)
EOS%: 8.4 % — ABNORMAL HIGH (ref 0.0–7.0)
HCT: 38.6 % (ref 38.4–49.9)
HGB: 13.3 g/dL (ref 13.0–17.1)
MCH: 30.6 pg (ref 27.2–33.4)
MCV: 88.7 fL (ref 79.3–98.0)
MONO%: 6.6 % (ref 0.0–14.0)
NEUT%: 69.5 % (ref 39.0–75.0)
nRBC: 0 % (ref 0–0)

## 2013-07-25 LAB — COMPREHENSIVE METABOLIC PANEL (CC13)
ALT: 28 U/L (ref 0–55)
AST: 26 U/L (ref 5–34)
Alkaline Phosphatase: 107 U/L (ref 40–150)
BUN: 12.1 mg/dL (ref 7.0–26.0)
Chloride: 103 mEq/L (ref 98–109)
Creatinine: 0.9 mg/dL (ref 0.7–1.3)
Glucose: 139 mg/dl (ref 70–140)
Total Bilirubin: 0.47 mg/dL (ref 0.20–1.20)

## 2013-07-25 NOTE — Telephone Encounter (Signed)
gv pt dtr appt schedule for October.  °

## 2013-07-26 NOTE — Progress Notes (Addendum)
[facility] [phone]   [phone]  SHARED VISIT PROGRESS NOTE  Piedad Climes, PA-C [address] [place] [address]  DIAGNOSIS: Metastatic non-small cell lung cancer favoring adenocarcinoma with negative EGFR mutation, negative ALK gene translocation and negative ROS1 diagnosed in August of 2013. Veristrat test Good.  PRIOR THERAPY:  1) Status post 4 cycles of systemic chemotherapy with carboplatin and Alimta with partial response at [facility]. Last dose was given in November of 2013.  2) Maintenance chemotherapy with Alimta 500 mg/M2 started on 09/26/2012, status post 9 cycles. Last dose was given on 06/05/2013. Discontinued secondary to disease progression. 3) Palliative radiotherapy to the lower thoracic and lumbar spine under the care of Dr. Michell Heinrich, completed 07/19/2013   CURRENT THERAPY:    Tarceva 150 mg by mouth daily. Patient just received the Tarceva and has not started this medication you    DISEASE STAGE: Stage IV  CHEMOTHERAPY INTENT: Palliative  CURRENT # OF CHEMOTHERAPY CYCLES: 1  CURRENT ANTIEMETICS: Zofran and Compazine  CURRENT SMOKING STATUS: Former smoker, quit 05/11/2012  ORAL CHEMOTHERAPY AND CONSENT: Tarceva and consent was signed today. CURRENT BISPHOSPHONATES USE: None  PAIN MANAGEMENT: 4/10  NARCOTICS INDUCED CONSTIPATION: None  LIVING WILL AND CODE STATUS: ?   INTERVAL HISTORY: Thomas May 67 y.o. male returns to the clinic today for followup visit accompanied by his daughter and his [place] interpreter. The patient completed palliative radiotherapy under the care of Dr. Michell Heinrich to the lower lumbar spines. He complains of continued pain in his right hip area which is worse at night. Upon further questioning he is only taking one 20 mg OxyContin tablet daily instead of 20 mg of OxyContin every 12 hours. Additionally he is only taking 2 of breakthrough Percocet tablets in a 24-hour  period. He is not been eating or drinking much and has lost a little more weight. The patient denied having any other significant complaints except for fatigue. He denied having any chest pain, shortness breath, cough or hemoptysis. He has no nausea or vomiting. His daughter states that he had some difficulty getting the Tarceva and just received it yesterday. He has not started the medication as yet. He was evaluated by Dr. Kristin Bruins and apparently he recommended extensive dental work to be done. The patient is interested in dental implants. He understands that all dental work would need to be done and well-healed prior to starting Xgeva.  MEDICAL HISTORY: Past Medical History  Diagnosis Date  . Tobacco use   . Lung mass   . BPH (benign prostatic hyperplasia)   . Lung cancer   . Coronary artery disease     ALLERGIES:  has No Known Allergies.  MEDICATIONS:  Current Outpatient Prescriptions  Medication Sig Dispense Refill  . erlotinib (TARCEVA) 150 MG tablet Take 1 tablet (150 mg total) by mouth daily. Take on an empty stomach 1 hour before meals or 2 hours after.  30 tablet  2  . lidocaine-prilocaine (EMLA) cream Apply topically as needed. Cover with plastic wrap 1.5 hours before chemo.  30 g  1  . Multiple Vitamin (MULTIVITAMIN) tablet Take 1 tablet by mouth daily.      Marland Kitchen oxyCODONE (OXYCONTIN) 20 MG 12 hr tablet Take 1 tablet (20 mg total) by mouth every 12 (twelve) hours.  60 tablet  0  . oxyCODONE-acetaminophen (PERCOCET/ROXICET) 5-325 MG per tablet Take 1 tablet by mouth every 6 (six) hours as needed for pain. May  take 2 tablets PO q 6 hours for severe pain - Do not take with Tylenol as this tablet already contains tylenol  60 tablet  0  . polyethylene glycol (MIRALAX / GLYCOLAX) packet Take 17 g by mouth daily.      . prochlorperazine (COMPAZINE) 10 MG tablet Take 5 mg by mouth every 6 (six) hours as needed. Take 0.5 tablets (5 mg total) by mouth every 6 (six) hours as needed for Nausea.       . Saw Palmetto, Serenoa repens, (SAW PALMETTO PO) Take 1 capsule by mouth daily.      Marland Kitchen terazosin (HYTRIN) 2 MG capsule Take 4 mg by mouth at bedtime.       No current facility-administered medications for this visit.    SURGICAL HISTORY:  Past Surgical History  Procedure Laterality Date  . Video bronchoscopy  05/31/2012    Procedure: VIDEO BRONCHOSCOPY WITH FLUORO;  Surgeon: Nyoka Cowden, MD;  Location: [facility];  Service: Endoscopy;  Laterality: Bilateral;    REVIEW OF SYSTEMS:  Constitutional: positive for anorexia, fatigue and weight loss Eyes: negative Ears, nose, mouth, throat, and face: negative Respiratory: negative Cardiovascular: negative Gastrointestinal: negative Genitourinary:negative Integument/breast: negative Hematologic/lymphatic: negative Musculoskeletal:positive for bone pain and muscle weakness Neurological: negative Behavioral/Psych: negative Endocrine: negative Allergic/Immunologic: negative   PHYSICAL EXAMINATION: General appearance: alert, cooperative, fatigued and no distress Head: Normocephalic, without obvious abnormality, atraumatic Neck: no adenopathy, no JVD, supple, symmetrical, trachea midline and thyroid not enlarged, symmetric, no tenderness/mass/nodules Lymph nodes: Cervical, supraclavicular, and axillary nodes normal. Resp: clear to auscultation bilaterally Back: symmetric, no curvature. ROM normal. No CVA tenderness. Cardio: regular rate and rhythm, S1, S2 normal, no murmur, click, rub or gallop GI: soft, non-tender; bowel sounds normal; no masses,  no organomegaly Extremities: extremities normal, atraumatic, no cyanosis or edema Neurologic: Alert and oriented X 3, normal strength and tone. Normal symmetric reflexes. Normal coordination and gait  ECOG PERFORMANCE STATUS: 2 - Symptomatic, <50% confined to bed  Blood pressure 120/82, pulse 76, temperature 96.8 F (36 C), temperature source Oral, resp. rate 20, height 5\' 6"  (1.676  m), weight 131 lb 1.6 oz (59.467 kg).  LABORATORY DATA: Lab Results  Component Value Date   WBC 7.3 07/25/2013   HGB 13.3 07/25/2013   HCT 38.6 07/25/2013   MCV 88.7 07/25/2013   PLT 176 07/25/2013      Chemistry      Component Value Date/Time   NA 136 07/25/2013 1308   NA 137 06/18/2013 1712   K 4.0 07/25/2013 1308   K 3.6 06/18/2013 1712   CL 100 06/18/2013 1712   CL 109* 04/03/2013 1155   CO2 24 07/25/2013 1308   CO2 23 06/18/2013 1712   BUN 12.1 07/25/2013 1308   BUN 12 06/18/2013 1712   CREATININE 0.9 07/25/2013 1308   CREATININE 0.80 06/18/2013 1712   CREATININE 0.65 06/02/2012 1611      Component Value Date/Time   CALCIUM 10.0 07/25/2013 1308   CALCIUM 10.0 06/18/2013 1712   ALKPHOS 107 07/25/2013 1308   ALKPHOS 110 06/18/2013 1712   AST 26 07/25/2013 1308   AST 34 06/18/2013 1712   ALT 28 07/25/2013 1308   ALT 38 06/18/2013 1712   BILITOT 0.47 07/25/2013 1308   BILITOT 0.6 06/18/2013 1712       RADIOGRAPHIC STUDIES: Ct Abdomen Pelvis Wo Contrast  06/18/2013   *RADIOLOGY REPORT*  Clinical Data: Back pain.  Metastatic lung cancer.  CT ABDOMEN AND PELVIS WITHOUT CONTRAST  Technique:  Multidetector CT imaging of the abdomen and pelvis was performed following the standard protocol without intravenous contrast.  Comparison: CT of the abdomen and pelvis 04/23/2013.  Findings:  Lung Bases: Incompletely visualized right middle lobe mass measuring at least 3.2 x 2.0 cm appears increased in size compared to the prior study 04/23/2013.  A trace amount of pleural thickening and/or fluid bilaterally.  The tip of a central venous catheter in the superior aspect of the right atrium.  A small hiatal hernia.  Abdomen/Pelvis:  5 mm nonobstructive calculus in the upper pole collecting system of the left kidney.  No additional calculi within the right renal collecting system and or along the course of either ureter.  No hydroureteronephrosis or perinephric stranding to suggest urinary tract obstruction at  this time.  Several bladder calculi are noted, and there is a diverticulum extending off the posterolateral aspect of the urinary bladder on the left side which also contains calculi.  The largest of these calculi is within the left diverticulum measuring 21 mm in length.  The unenhanced appearance of the liver, gallbladder, pancreas, spleen and bilateral adrenal glands is unremarkable.  There is atherosclerosis throughout the abdominal and pelvic vasculature, without evidence of aneurysm.  No significant volume of ascites. No pneumoperitoneum.  No pathologic distension of small bowel.  No definite pathologic lymphadenopathy identified within the abdomen or pelvis on today's noncontrast CT examination.  Normal appendix.  Musculoskeletal: Compared to the prior study there has been interval enlargement of numerous predominately lytic aggressive appearing lesions throughout the visualized axial and appendicular skeleton, compatible with progression of metastatic disease to the bones.  Several of these lesions in the spine appear to be associated with some pathologic compression, most notably involving the superior endplate of T12 posteriorly, the posterior aspect of L4 on the right side, where there is a small amount of a the soft tissue impinging upon the underlying spinal canal, and the posterolateral aspect of L5.  IMPRESSION: 1.  Progression of metastatic disease to the lungs and bones with interval development of pathologic compression fractures at T12, L4- L5, as above. 2.  5 mm nonobstructive calculus in the left renal collecting system and multiple bladder calculi redemonstrated, as above. 3.  Extensive atherosclerosis. 4.  Additional incidental findings, as above.   Original Report Authenticated By: Trudie Reed, M.D.   Dg Lumbar Spine Complete  06/18/2013   *RADIOLOGY REPORT*  Clinical Data: Pain.  LUMBAR SPINE - COMPLETE 4+ VIEW  Comparison: 04/23/2013  Findings: The bones are diffusely osteopenic.   Normal alignment of the lumbar spine.  The vertebral body heights are well preserved. There is multilevel disc space narrowing and ventral endplate spurring consistent with spondylosis.  Facet hypertrophy and degenerative changes also noted.  There is a calcified atherosclerotic disease affecting the abdominal aorta. Stone within the upper pole of the left kidney measures 3 mm.  Bladder calculi are again identified including stones within the bladder diverticula.  IMPRESSION:  1.  Osteopenia and mild spondylosis.   Original Report Authenticated By: Signa Kell, M.D.   Dg Hip Complete Right  06/18/2013   *RADIOLOGY REPORT*  Clinical Data: Back pain.  RIGHT HIP - COMPLETE 2+ VIEW  Comparison: None  Findings: There is no evidence of fracture or dislocation.  There is no evidence of arthropathy or other focal bone abnormality. Soft tissues are unremarkable.  IMPRESSION: Negative exam.   Original Report Authenticated By: Signa Kell, M.D.   Ct Chest W Contrast  06/22/2013  CLINICAL DATA:  History of lung cancer diagnosed in August 2013. Chemotherapy in progress.  EXAM: CT CHEST WITH CONTRAST  TECHNIQUE: Multidetector CT imaging of the chest was performed during intravenous contrast administration.  CONTRAST:  100 OMNIPAQUE IOHEXOL 300 MG/ML SOLN, 80mL OMNIPAQUE IOHEXOL 300 MG/ML SOLN  COMPARISON:  CT of the chest, abdomen and pelvis 04/23/2013.  FINDINGS: Mediastinum: Heart size is mildly enlarged. There is no significant pericardial fluid, thickening or pericardial calcification. There is atherosclerosis of the thoracic aorta, the great vessels of the mediastinum and the coronary arteries, including calcified atherosclerotic plaque in the left main, left anterior descending and left circumflex coronary arteries. No pathologically enlarged mediastinal or hilar lymph nodes. Esophagus is unremarkable in appearance. Right internal jugular single-lumen porta cath with tip terminating at the superior cavoatrial  junction.  Lungs/Pleura: Compared to the prior study, the previously noted right middle lobe pulmonary nodule has significantly increased in size, currently a 3.3 x 2.0 cm mass with macrolobulated and spiculated margins. Additionally, the previously noted satellite nodules have also increased, largest of which measures 1.1 cm in diameter (image 31 of series 5). Surrounding postobstructive changes are noted in the adjacent lung parenchyma in the right middle lobe. In addition, there is a new peripheral nodular opacity in the lateral aspect of the right lower lobe measuring approximately 1.2 x 1.0 cm (image 22 of series 5). Previously noted 8 mm right upper lobe nodule (image 16 of series 5) and a right upper lobe pulmonary nodule measuring 4 mm (image 13 of series 5) are both unchanged. Small amount of pleural thickening in the thorax bilaterally. No frank pleural effusions.  Upper Abdomen: Mild intrahepatic biliary ductal dilatation. 11 mm low-attenuation lesion in segment 8 of the liver is unchanged. 5 mm nonobstructive calculus in the upper pole collecting system of the left kidney is unchanged.  Musculoskeletal: In the posterolateral aspect of the left 1st rib and lateral aspect of the left 3rd rib there are lytic lesions which have a "moth-eaten" appearance, compatible with metastatic lesions. In addition, there is a mixed lytic and sclerotic lesion in T12 vertebral body posteriorly which has enlarged compared to the prior examination. Additionally, there is a lytic lesion involving the lamina of T12 on the left.  IMPRESSION: 1. Progression of disease as demonstrated by enlargement of the primary right middle lobe mass, increased size of satellite nodules in the right middle lobe, new suspicious nodule in the right lower lobe, and progression of metastatic disease to the bones, as detailed above. 2. Cardiomegaly with left main and 2 vessel coronary artery disease. 3. Additional incidental findings, as above,  similar prior studies.   Electronically Signed   By: Trudie Reed M.D.   On: 06/22/2013 17:01   Mr Thoracic Spine W Wo Contrast  07/03/2013   CLINICAL DATA:  Metastatic lung cancer.  EXAM: MRI THORACIC AND LUMBAR SPINE WITHOUT AND WITH CONTRAST  TECHNIQUE: Multiplanar and multiecho pulse sequences of the thoracic and lumbar spine were obtained without and with intravenous contrast.  CONTRAST:  12mL MULTIHANCE GADOBENATE DIMEGLUMINE 529 MG/ML IV SOLN  COMPARISON:  None.  CT abdomen pelvis June 18, 2013  FINDINGS: MR THORACIC SPINE FINDINGS  Multiple metastatic deposits in the thoracic spine. Mild pathologic fracture T12. No other fracture is identified. No cord compression is identified.  Tumor in the posterior elements of T2. Paraspinous enhancing tissue may represent tumor. No tumor in the canal.  Tumor in the right 5 rib and adjacent soft tissues  Tumor  in the left 7th rib in adjacent soft tissues.  Tumor in the left pedicle of T11  Tumor throughout the T12 vertebral body with a mild pathologic fracture. There is mild ventral epidural tumor. There is tumor in the posterior elements on the left with mild posterior epidural tumor. No cord compression.  MR LUMBAR SPINE FINDINGS  Widespread metastatic disease in the lumbar spine. There is tumor at S1. There is tumor in the iliac bones bilaterally. Pathologic fracture T12 as described above. Mild pathologic fracture of L4 and L5.  Conus medullaris terminates at mid L1. No compression of the conus. There is mild spinal stenosis at T12 due to epidural tumor.  Tumor in the super endplate of L2.  Tumor throughout the L4 vertebral body extending into the right pedicle. Mild pathologic fracture superior endplate. There is epidural tumor on the right extending into the right foramen and lateral recess. This could cause impingement of the right L4 and L5 nerve roots.  Mild pathologic fracture L5. There is predominantly left-sided tumor extending into the pedicle  with a mild amount of surrounding epidural tumor. Bilateral facet hypertrophy and mild spinal stenosis.  IMPRESSION: MR THORACIC SPINE IMPRESSION  Multiple areas of metastatic disease in the thoracic spine. Mild pathologic fracture T12. Negative for cord compression.  MR LUMBAR SPINE IMPRESSION  Diffuse metastatic disease in the lumbar spine and sacral spine and iliac bones bilaterally.  Mild pathologic fracture of L4 with epidural tumor on the right causing right foraminal encroachment and possible impingement of the right L5 nerve root.  Mild pathologic fracture of L5 on the left side.   Electronically Signed   By: Marlan Palau M.D.   On: 07/03/2013 18:37   Mr Lumbar Spine W Wo Contrast  07/03/2013   CLINICAL DATA:  Metastatic lung cancer.  EXAM: MRI THORACIC AND LUMBAR SPINE WITHOUT AND WITH CONTRAST  TECHNIQUE: Multiplanar and multiecho pulse sequences of the thoracic and lumbar spine were obtained without and with intravenous contrast.  CONTRAST:  12mL MULTIHANCE GADOBENATE DIMEGLUMINE 529 MG/ML IV SOLN  COMPARISON:  None.  CT abdomen pelvis June 18, 2013  FINDINGS: MR THORACIC SPINE FINDINGS  Multiple metastatic deposits in the thoracic spine. Mild pathologic fracture T12. No other fracture is identified. No cord compression is identified.  Tumor in the posterior elements of T2. Paraspinous enhancing tissue may represent tumor. No tumor in the canal.  Tumor in the right 5 rib and adjacent soft tissues  Tumor in the left 7th rib in adjacent soft tissues.  Tumor in the left pedicle of T11  Tumor throughout the T12 vertebral body with a mild pathologic fracture. There is mild ventral epidural tumor. There is tumor in the posterior elements on the left with mild posterior epidural tumor. No cord compression.  MR LUMBAR SPINE FINDINGS  Widespread metastatic disease in the lumbar spine. There is tumor at S1. There is tumor in the iliac bones bilaterally. Pathologic fracture T12 as described above. Mild  pathologic fracture of L4 and L5.  Conus medullaris terminates at mid L1. No compression of the conus. There is mild spinal stenosis at T12 due to epidural tumor.  Tumor in the super endplate of L2.  Tumor throughout the L4 vertebral body extending into the right pedicle. Mild pathologic fracture superior endplate. There is epidural tumor on the right extending into the right foramen and lateral recess. This could cause impingement of the right L4 and L5 nerve roots.  Mild pathologic fracture L5. There is predominantly left-sided  tumor extending into the pedicle with a mild amount of surrounding epidural tumor. Bilateral facet hypertrophy and mild spinal stenosis.  IMPRESSION: MR THORACIC SPINE IMPRESSION  Multiple areas of metastatic disease in the thoracic spine. Mild pathologic fracture T12. Negative for cord compression.  MR LUMBAR SPINE IMPRESSION  Diffuse metastatic disease in the lumbar spine and sacral spine and iliac bones bilaterally.  Mild pathologic fracture of L4 with epidural tumor on the right causing right foraminal encroachment and possible impingement of the right L5 nerve root.  Mild pathologic fracture of L5 on the left side.   Electronically Signed   By: Marlan Palau M.D.   On: 07/03/2013 18:37   Mr Hip Right W Wo Contrast  07/03/2013   CLINICAL DATA:  Low back pain and severe or spread that low back pain and right hip pain. Metastatic lung cancer.  EXAM: MRI OF THE RIGHT HIP WITHOUT AND WITH CONTRAST  TECHNIQUE: Multiplanar, multisequence MR imaging was performed both before and after administration of intravenous contrast.  CONTRAST:  12 cc MultiHance  COMPARISON:  CT scan dated 06/18/2013  FINDINGS: Lower lumbar metastatic lesions noted with impingement as described on lumbar MRI from 07/03/2013.  Osseous metastatic disease to the pelvis includes a 2.1 x 1.7 cm right iliac bone lesion on image 5 of series 4, a 2.0 x 1.2 cm right iliac bone lesion with posterior cortical irregularity,  suspected extraosseous extension, and adjacent edema and enhancement in the gluteus minimus muscle on image 11 of series 4 ; and scattered additional lesions of both iliac bones, the upper sacrum, and the right ischial tuberosity.  The ischial tuberosity lesion partially undermines the hamstring origination site (especially of the adductor magnus) and is associated with edema along the quadratus femoris on the right. No sciatic notch impingement or abnormal sciatic nerve asymmetry noted.  No proximal femoral metastatic lesions observed.  There are several filling defects in the urinary bladder compatible with bladder stones as shown previously. Median lobe of the prostate indents the bladder base.  IMPRESSION: 1. Multiple osseous pelvic metastatic lesions. One of the right iliac lesions has extraosseous extension along the right gluteus minimus which also demonstrates enhancement and edema. The right ischial tuberosity lesion undermines the adductor magnus origination site and is associated with extraosseous extension and edema along the right quadratus femorals muscle. 2. Lower lumbar spine metastatic lesions with extraosseous extension likely causing impingement -please see report from lumbar spine MRI. 3. Bladder stones.   Electronically Signed   By: Herbie Baltimore   On: 07/03/2013 18:56    ASSESSMENT AND PLAN: This is a very pleasant 67 years old [place] male with metastatic non-small cell lung cancer, adenocarcinoma status post systemic chemotherapy with carboplatin and Alimta followed by 9 cycles of maintenance chemotherapy with single agent Alimta but unfortunately has evidence for disease progression with multiple bone metastases as well as progression of the disease in his lung. The patient has completed palliative radiotherapy under the care of Dr. Michell Heinrich. His Veristrat test was good. He just received his Tarceva. Patient was discussed with and also seen by Dr. Arbutus Ped. Patient will start his  Tarceva 150 mg by mouth daily and return in 2 weeks for a symptom management visit. His pain medication instructions were clarified the patient understands he is to take 20 mg of OxyContin every 12 hours and may take his Percocet one tablet every 6 hours as needed for breakthrough pain. He was encouraged to increase his by mouth intake both of  food and fluids. He will give some more consideration to his dental work versus of risk benefits of initiating Xgeva therapy as discussed with Dr. Arbutus Ped.  Laural Benes, Shuree Brossart E, PA-C   The patient was advised to call immediately if he has any concerning symptoms in the interval. The patient voices understanding of current disease status and treatment options and is in agreement with the current care plan.  All questions were answered. The patient knows to call the clinic with any problems, questions or concerns. We can certainly see the patient much sooner if necessary.  ADDENDUM: Hematology/Oncology Attending: I had a face to face encounter with the patient. I recommended his care plan. This is a very pleasant 67 years old [place] male with metastatic non-small cell lung cancer with recent evidence for disease progression after treatment with maintenance Alimta. I recommended for the patient's treatment with Tarceva 150 mg by mouth daily. He just received the medication yesterday and expected to start the first cycle tomorrow. The patient continues to complain of pain on the lower back and hip area. He does not take his pain medication as prescribed. I recommended for the patient to take OxyContin 20 mg by mouth every 12 hours in addition to Percocet for breakthrough pain. The patient was also seen by Dr. Kristin Bruins for dental evaluation and he would require an extensive dental work and this is currently on hold. He may require her treatment with Rivka Barbara for his metastatic bone disease as well as hypercalcemia if the patient and his family are in agreement with the  expected risk of osteonecrosis of the jaw.  The patient would come back for follow up visit in 2 weeks for evaluation and management any adverse effect of his treatment. He was advised to call immediately if he has any concerning symptoms in the interval. Lajuana Matte., MD 07/28/2013

## 2013-07-26 NOTE — Patient Instructions (Signed)
Start taking Tarceva 150 mg by mouth daily as instructed  Take OxyContin 20 mg by mouth every 12 hours.  Continue to take Percocet 5/325 mg, 1 tablet by mouth every 6 hours as needed for breakthrough pain Follow up in 2 weeks

## 2013-07-30 ENCOUNTER — Telehealth: Payer: Self-pay

## 2013-07-30 ENCOUNTER — Other Ambulatory Visit: Payer: Self-pay | Admitting: *Deleted

## 2013-07-30 DIAGNOSIS — M8430XS Stress fracture, unspecified site, sequela: Secondary | ICD-10-CM

## 2013-07-30 MED ORDER — OXYCODONE-ACETAMINOPHEN 5-325 MG PO TABS: 1.0000 | ORAL_TABLET | Freq: Four times a day (QID) | ORAL | Status: DC | PRN

## 2013-07-30 MED ORDER — OXYCODONE HCL ER 20 MG PO T12A: 20.0000 mg | EXTENDED_RELEASE_TABLET | Freq: Two times a day (BID) | ORAL | Status: DC

## 2013-07-30 NOTE — Telephone Encounter (Signed)
Patient's daughter called stating pt needs a refill of percocet. Rx last filled 07/10/13 #60 with 0.  Pt last seen 06/27/13. Pls advise.

## 2013-07-30 NOTE — Telephone Encounter (Signed)
Patient is prescribed the percocet by his oncologist who also prescribes his OxyContin.  He should get refills from this provider (Dr. Arbutus Ped) as more than one provider should not be prescribing him the same controlled substance.

## 2013-07-30 NOTE — Telephone Encounter (Signed)
Spoke with Thomas May and she states she called the oncologist for refills. Pt's daughter stated she could not remember where pt had gotten the medication initially. She verbalized understanding of having medication refilled by Dr. Arbutus Ped.

## 2013-08-06 ENCOUNTER — Telehealth: Payer: Self-pay | Admitting: Medical Oncology

## 2013-08-06 DIAGNOSIS — R21 Rash and other nonspecific skin eruption: Secondary | ICD-10-CM

## 2013-08-06 MED ORDER — CLINDAMYCIN PHOSPHATE 1 % EX GEL: Freq: Two times a day (BID) | CUTANEOUS | Status: DC

## 2013-08-06 NOTE — Telephone Encounter (Signed)
Called in rx per Tiana Loft.

## 2013-08-06 NOTE — Telephone Encounter (Signed)
Rash on face is worse and on back. Some areas are bleeding . He is applying hydrocortisone and "diaper rash cream' Pt is taking Megace for appetite stimulant. His PCP ordered it some time ago and pt stopped it until this weekend he started it back.

## 2013-08-09 ENCOUNTER — Ambulatory Visit (HOSPITAL_BASED_OUTPATIENT_CLINIC_OR_DEPARTMENT_OTHER): Payer: BC Managed Care – PPO | Admitting: Physician Assistant

## 2013-08-09 ENCOUNTER — Telehealth: Payer: Self-pay | Admitting: Internal Medicine

## 2013-08-09 ENCOUNTER — Other Ambulatory Visit (HOSPITAL_BASED_OUTPATIENT_CLINIC_OR_DEPARTMENT_OTHER): Payer: BC Managed Care – PPO | Admitting: Lab

## 2013-08-09 VITALS — BP 111/73 | HR 83 | Temp 97.7°F | Resp 19 | Ht 66.0 in | Wt 128.9 lb

## 2013-08-09 DIAGNOSIS — C3430 Malignant neoplasm of lower lobe, unspecified bronchus or lung: Secondary | ICD-10-CM

## 2013-08-09 DIAGNOSIS — C3490 Malignant neoplasm of unspecified part of unspecified bronchus or lung: Secondary | ICD-10-CM

## 2013-08-09 DIAGNOSIS — K5900 Constipation, unspecified: Secondary | ICD-10-CM

## 2013-08-09 DIAGNOSIS — R5381 Other malaise: Secondary | ICD-10-CM

## 2013-08-09 DIAGNOSIS — R410 Disorientation, unspecified: Secondary | ICD-10-CM

## 2013-08-09 DIAGNOSIS — C7951 Secondary malignant neoplasm of bone: Secondary | ICD-10-CM

## 2013-08-09 DIAGNOSIS — F29 Unspecified psychosis not due to a substance or known physiological condition: Secondary | ICD-10-CM

## 2013-08-09 LAB — CBC WITH DIFFERENTIAL/PLATELET
BASO%: 0.1 % (ref 0.0–2.0)
Basophils Absolute: 0 10*3/uL (ref 0.0–0.1)
Eosinophils Absolute: 0 10*3/uL (ref 0.0–0.5)
HCT: 37.6 % — ABNORMAL LOW (ref 38.4–49.9)
LYMPH%: 5.1 % — ABNORMAL LOW (ref 14.0–49.0)
MCHC: 33.7 g/dL (ref 32.0–36.0)
MONO#: 0.7 10*3/uL (ref 0.1–0.9)
NEUT#: 8.7 10*3/uL — ABNORMAL HIGH (ref 1.5–6.5)
Platelets: 330 10*3/uL (ref 140–400)
RBC: 4.15 10*6/uL — ABNORMAL LOW (ref 4.20–5.82)
WBC: 10 10*3/uL (ref 4.0–10.3)
lymph#: 0.5 10*3/uL — ABNORMAL LOW (ref 0.9–3.3)

## 2013-08-09 LAB — COMPREHENSIVE METABOLIC PANEL (CC13)
ALT: 149 U/L — ABNORMAL HIGH (ref 0–55)
Anion Gap: 11 mEq/L (ref 3–11)
BUN: 13.3 mg/dL (ref 7.0–26.0)
CO2: 20 mEq/L — ABNORMAL LOW (ref 22–29)
Calcium: 9.8 mg/dL (ref 8.4–10.4)
Chloride: 105 mEq/L (ref 98–109)
Potassium: 3.5 mEq/L (ref 3.5–5.1)
Sodium: 136 mEq/L (ref 136–145)
Total Protein: 7.7 g/dL (ref 6.4–8.3)

## 2013-08-09 NOTE — Telephone Encounter (Signed)
Gave pt appt for lab,md and MRI on November 2014

## 2013-08-13 ENCOUNTER — Other Ambulatory Visit: Payer: Self-pay | Admitting: *Deleted

## 2013-08-13 DIAGNOSIS — M8430XS Stress fracture, unspecified site, sequela: Secondary | ICD-10-CM

## 2013-08-13 MED ORDER — OXYCODONE-ACETAMINOPHEN 5-325 MG PO TABS: 1.0000 | ORAL_TABLET | Freq: Four times a day (QID) | ORAL | Status: DC | PRN

## 2013-08-13 NOTE — Progress Notes (Addendum)
[facility] [phone]   [phone]  SHARED VISIT PROGRESS NOTE  Piedad Climes, PA-C [address] [place] [address]  DIAGNOSIS: Metastatic non-small cell lung cancer favoring adenocarcinoma with negative EGFR mutation, negative ALK gene translocation and negative ROS1 diagnosed in August of 2013. Veristrat test Good.  PRIOR THERAPY:  1) Status post 4 cycles of systemic chemotherapy with carboplatin and Alimta with partial response at [facility]. Last dose was given in November of 2013.  2) Maintenance chemotherapy with Alimta 500 mg/M2 started on 09/26/2012, status post 9 cycles. Last dose was given on 06/05/2013. Discontinued secondary to disease progression. 3) Palliative radiotherapy to the lower thoracic and lumbar spine under the care of Dr. Michell Heinrich, completed 07/19/2013   CURRENT THERAPY:    Tarceva 150 mg by mouth daily. Status post approximately 2 weeks of therapy    DISEASE STAGE: Stage IV  CHEMOTHERAPY INTENT: Palliative  CURRENT # OF CHEMOTHERAPY CYCLES: 1  CURRENT ANTIEMETICS: Zofran and Compazine  CURRENT SMOKING STATUS: Former smoker, quit 05/11/2012  ORAL CHEMOTHERAPY AND CONSENT: Tarceva and consent was signed today. CURRENT BISPHOSPHONATES USE: None  PAIN MANAGEMENT: 4/10  NARCOTICS INDUCED CONSTIPATION: None  LIVING WILL AND CODE STATUS: ?   INTERVAL HISTORY: Thomas May 67 y.o. male returns to the clinic today for followup visit accompanied by his daughter and his [place] interpreter. The patient completed palliative radiotherapy under the care of Dr. Michell Heinrich to the lower lumbar spines. He is hip and back pain are better after completing radiation therapy and taking his pain medication correctly. He does complain of severe constipation requiring the use of an enema. He discontinued taking his MiraLAX while he was receiving his radiation therapy. The family notes increased confusion. They are  unsure as to whether the increased confusion is related to the increased pain medication or related to his underlying lung cancer.  He still is not  eating or drinking much and has lost a little more weight. The patient denied having any other significant complaints except for fatigue. He denied having any chest pain, shortness breath, cough or hemoptysis. He has no nausea or vomiting. He was evaluated by Dr. Kristin Bruins and apparently he recommended extensive dental work to be done. The patient is interested in dental implants. He understands that all dental work would need to be done and well-healed prior to starting Xgeva.  MEDICAL HISTORY: Past Medical History  Diagnosis Date  . Tobacco use   . Lung mass   . BPH (benign prostatic hyperplasia)   . Lung cancer   . Coronary artery disease     ALLERGIES:  has No Known Allergies.  MEDICATIONS:  Current Outpatient Prescriptions  Medication Sig Dispense Refill  . clindamycin (CLINDAGEL) 1 % gel Apply topically 2 (two) times daily.  30 g  0  . erlotinib (TARCEVA) 150 MG tablet Take 1 tablet (150 mg total) by mouth daily. Take on an empty stomach 1 hour before meals or 2 hours after.  30 tablet  2  . Multiple Vitamin (MULTIVITAMIN) tablet Take 1 tablet by mouth daily.      . OxyCODONE (OXYCONTIN) 20 mg T12A 12 hr tablet Take 1 tablet (20 mg total) by mouth every 12 (twelve) hours.  60 tablet  0  . Saw Palmetto, Serenoa repens, (SAW PALMETTO PO) Take 1 capsule by mouth daily.      Marland Kitchen terazosin (HYTRIN) 2 MG capsule Take 4 mg by mouth at bedtime.      Marland Kitchen  lidocaine-prilocaine (EMLA) cream Apply topically as needed. Cover with plastic wrap 1.5 hours before chemo.  30 g  1  . oxyCODONE-acetaminophen (PERCOCET/ROXICET) 5-325 MG per tablet Take 1 tablet by mouth every 6 (six) hours as needed for pain. May take 2 tablets PO q 6 hours for severe pain - Do not take with Tylenol as this tablet already contains tylenol  60 tablet  0  . polyethylene glycol  (MIRALAX / GLYCOLAX) packet Take 17 g by mouth daily.      . prochlorperazine (COMPAZINE) 10 MG tablet Take 5 mg by mouth every 6 (six) hours as needed. Take 0.5 tablets (5 mg total) by mouth every 6 (six) hours as needed for Nausea.       No current facility-administered medications for this visit.    SURGICAL HISTORY:  Past Surgical History  Procedure Laterality Date  . Video bronchoscopy  05/31/2012    Procedure: VIDEO BRONCHOSCOPY WITH FLUORO;  Surgeon: Nyoka Cowden, MD;  Location: [facility];  Service: Endoscopy;  Laterality: Bilateral;    REVIEW OF SYSTEMS:  Constitutional: positive for anorexia, fatigue and weight loss Eyes: negative Ears, nose, mouth, throat, and face: negative Respiratory: negative Cardiovascular: negative Gastrointestinal: positive for constipation Genitourinary:negative Integument/breast: negative Hematologic/lymphatic: negative Musculoskeletal:positive for bone pain, muscle weakness and improved Neurological: positive for increased confusion Behavioral/Psych: negative Endocrine: negative Allergic/Immunologic: negative   PHYSICAL EXAMINATION: General appearance: alert, cooperative, fatigued and no distress Head: Normocephalic, without obvious abnormality, atraumatic Neck: no adenopathy, no JVD, supple, symmetrical, trachea midline and thyroid not enlarged, symmetric, no tenderness/mass/nodules Lymph nodes: Cervical, supraclavicular, and axillary nodes normal. Resp: clear to auscultation bilaterally Back: symmetric, no curvature. ROM normal. No CVA tenderness. Cardio: regular rate and rhythm, S1, S2 normal, no murmur, click, rub or gallop GI: soft, non-tender; bowel sounds normal; no masses,  no organomegaly Extremities: extremities normal, atraumatic, no cyanosis or edema Neurologic: Alert and oriented X 3, normal strength and tone. Normal symmetric reflexes. Normal coordination and gait  ECOG PERFORMANCE STATUS: 2 - Symptomatic, <50% confined to  bed  Blood pressure 111/73, pulse 83, temperature 97.7 F (36.5 C), temperature source Oral, resp. rate 19, height 5\' 6"  (1.676 m), weight 128 lb 14.4 oz (58.469 kg).  LABORATORY DATA: Lab Results  Component Value Date   WBC 10.0 08/09/2013   HGB 12.7* 08/09/2013   HCT 37.6* 08/09/2013   MCV 90.6 08/09/2013   PLT 330 08/09/2013      Chemistry      Component Value Date/Time   NA 136 08/09/2013 1316   NA 137 06/18/2013 1712   K 3.5 08/09/2013 1316   K 3.6 06/18/2013 1712   CL 100 06/18/2013 1712   CL 109* 04/03/2013 1155   CO2 20* 08/09/2013 1316   CO2 23 06/18/2013 1712   BUN 13.3 08/09/2013 1316   BUN 12 06/18/2013 1712   CREATININE 0.8 08/09/2013 1316   CREATININE 0.80 06/18/2013 1712   CREATININE 0.65 06/02/2012 1611      Component Value Date/Time   CALCIUM 9.8 08/09/2013 1316   CALCIUM 10.0 06/18/2013 1712   ALKPHOS 120 08/09/2013 1316   ALKPHOS 110 06/18/2013 1712   AST 75* 08/09/2013 1316   AST 34 06/18/2013 1712   ALT 149* 08/09/2013 1316   ALT 38 06/18/2013 1712   BILITOT 1.11 08/09/2013 1316   BILITOT 0.6 06/18/2013 1712       RADIOGRAPHIC STUDIES: Ct Abdomen Pelvis Wo Contrast  06/18/2013   *RADIOLOGY REPORT*  Clinical Data: Back pain.  Metastatic lung cancer.  CT ABDOMEN AND PELVIS WITHOUT CONTRAST  Technique:  Multidetector CT imaging of the abdomen and pelvis was performed following the standard protocol without intravenous contrast.  Comparison: CT of the abdomen and pelvis 04/23/2013.  Findings:  Lung Bases: Incompletely visualized right middle lobe mass measuring at least 3.2 x 2.0 cm appears increased in size compared to the prior study 04/23/2013.  A trace amount of pleural thickening and/or fluid bilaterally.  The tip of a central venous catheter in the superior aspect of the right atrium.  A small hiatal hernia.  Abdomen/Pelvis:  5 mm nonobstructive calculus in the upper pole collecting system of the left kidney.  No additional calculi within the right renal collecting  system and or along the course of either ureter.  No hydroureteronephrosis or perinephric stranding to suggest urinary tract obstruction at this time.  Several bladder calculi are noted, and there is a diverticulum extending off the posterolateral aspect of the urinary bladder on the left side which also contains calculi.  The largest of these calculi is within the left diverticulum measuring 21 mm in length.  The unenhanced appearance of the liver, gallbladder, pancreas, spleen and bilateral adrenal glands is unremarkable.  There is atherosclerosis throughout the abdominal and pelvic vasculature, without evidence of aneurysm.  No significant volume of ascites. No pneumoperitoneum.  No pathologic distension of small bowel.  No definite pathologic lymphadenopathy identified within the abdomen or pelvis on today's noncontrast CT examination.  Normal appendix.  Musculoskeletal: Compared to the prior study there has been interval enlargement of numerous predominately lytic aggressive appearing lesions throughout the visualized axial and appendicular skeleton, compatible with progression of metastatic disease to the bones.  Several of these lesions in the spine appear to be associated with some pathologic compression, most notably involving the superior endplate of T12 posteriorly, the posterior aspect of L4 on the right side, where there is a small amount of a the soft tissue impinging upon the underlying spinal canal, and the posterolateral aspect of L5.  IMPRESSION: 1.  Progression of metastatic disease to the lungs and bones with interval development of pathologic compression fractures at T12, L4- L5, as above. 2.  5 mm nonobstructive calculus in the left renal collecting system and multiple bladder calculi redemonstrated, as above. 3.  Extensive atherosclerosis. 4.  Additional incidental findings, as above.   Original Report Authenticated By: Trudie Reed, M.D.   Dg Lumbar Spine Complete  06/18/2013    *RADIOLOGY REPORT*  Clinical Data: Pain.  LUMBAR SPINE - COMPLETE 4+ VIEW  Comparison: 04/23/2013  Findings: The bones are diffusely osteopenic.  Normal alignment of the lumbar spine.  The vertebral body heights are well preserved. There is multilevel disc space narrowing and ventral endplate spurring consistent with spondylosis.  Facet hypertrophy and degenerative changes also noted.  There is a calcified atherosclerotic disease affecting the abdominal aorta. Stone within the upper pole of the left kidney measures 3 mm.  Bladder calculi are again identified including stones within the bladder diverticula.  IMPRESSION:  1.  Osteopenia and mild spondylosis.   Original Report Authenticated By: Signa Kell, M.D.   Dg Hip Complete Right  06/18/2013   *RADIOLOGY REPORT*  Clinical Data: Back pain.  RIGHT HIP - COMPLETE 2+ VIEW  Comparison: None  Findings: There is no evidence of fracture or dislocation.  There is no evidence of arthropathy or other focal bone abnormality. Soft tissues are unremarkable.  IMPRESSION: Negative exam.   Original Report Authenticated By: Ladona Ridgel  Bradly Chris, M.D.   Ct Chest W Contrast  06/22/2013   CLINICAL DATA:  History of lung cancer diagnosed in August 2013. Chemotherapy in progress.  EXAM: CT CHEST WITH CONTRAST  TECHNIQUE: Multidetector CT imaging of the chest was performed during intravenous contrast administration.  CONTRAST:  100 OMNIPAQUE IOHEXOL 300 MG/ML SOLN, 80mL OMNIPAQUE IOHEXOL 300 MG/ML SOLN  COMPARISON:  CT of the chest, abdomen and pelvis 04/23/2013.  FINDINGS: Mediastinum: Heart size is mildly enlarged. There is no significant pericardial fluid, thickening or pericardial calcification. There is atherosclerosis of the thoracic aorta, the great vessels of the mediastinum and the coronary arteries, including calcified atherosclerotic plaque in the left main, left anterior descending and left circumflex coronary arteries. No pathologically enlarged mediastinal or hilar lymph  nodes. Esophagus is unremarkable in appearance. Right internal jugular single-lumen porta cath with tip terminating at the superior cavoatrial junction.  Lungs/Pleura: Compared to the prior study, the previously noted right middle lobe pulmonary nodule has significantly increased in size, currently a 3.3 x 2.0 cm mass with macrolobulated and spiculated margins. Additionally, the previously noted satellite nodules have also increased, largest of which measures 1.1 cm in diameter (image 31 of series 5). Surrounding postobstructive changes are noted in the adjacent lung parenchyma in the right middle lobe. In addition, there is a new peripheral nodular opacity in the lateral aspect of the right lower lobe measuring approximately 1.2 x 1.0 cm (image 22 of series 5). Previously noted 8 mm right upper lobe nodule (image 16 of series 5) and a right upper lobe pulmonary nodule measuring 4 mm (image 13 of series 5) are both unchanged. Small amount of pleural thickening in the thorax bilaterally. No frank pleural effusions.  Upper Abdomen: Mild intrahepatic biliary ductal dilatation. 11 mm low-attenuation lesion in segment 8 of the liver is unchanged. 5 mm nonobstructive calculus in the upper pole collecting system of the left kidney is unchanged.  Musculoskeletal: In the posterolateral aspect of the left 1st rib and lateral aspect of the left 3rd rib there are lytic lesions which have a "moth-eaten" appearance, compatible with metastatic lesions. In addition, there is a mixed lytic and sclerotic lesion in T12 vertebral body posteriorly which has enlarged compared to the prior examination. Additionally, there is a lytic lesion involving the lamina of T12 on the left.  IMPRESSION: 1. Progression of disease as demonstrated by enlargement of the primary right middle lobe mass, increased size of satellite nodules in the right middle lobe, new suspicious nodule in the right lower lobe, and progression of metastatic disease to the  bones, as detailed above. 2. Cardiomegaly with left main and 2 vessel coronary artery disease. 3. Additional incidental findings, as above, similar prior studies.   Electronically Signed   By: Trudie Reed M.D.   On: 06/22/2013 17:01   Mr Thoracic Spine W Wo Contrast  07/03/2013   CLINICAL DATA:  Metastatic lung cancer.  EXAM: MRI THORACIC AND LUMBAR SPINE WITHOUT AND WITH CONTRAST  TECHNIQUE: Multiplanar and multiecho pulse sequences of the thoracic and lumbar spine were obtained without and with intravenous contrast.  CONTRAST:  12mL MULTIHANCE GADOBENATE DIMEGLUMINE 529 MG/ML IV SOLN  COMPARISON:  None.  CT abdomen pelvis June 18, 2013  FINDINGS: MR THORACIC SPINE FINDINGS  Multiple metastatic deposits in the thoracic spine. Mild pathologic fracture T12. No other fracture is identified. No cord compression is identified.  Tumor in the posterior elements of T2. Paraspinous enhancing tissue may represent tumor. No tumor in the canal.  Tumor in the right 5 rib and adjacent soft tissues  Tumor in the left 7th rib in adjacent soft tissues.  Tumor in the left pedicle of T11  Tumor throughout the T12 vertebral body with a mild pathologic fracture. There is mild ventral epidural tumor. There is tumor in the posterior elements on the left with mild posterior epidural tumor. No cord compression.  MR LUMBAR SPINE FINDINGS  Widespread metastatic disease in the lumbar spine. There is tumor at S1. There is tumor in the iliac bones bilaterally. Pathologic fracture T12 as described above. Mild pathologic fracture of L4 and L5.  Conus medullaris terminates at mid L1. No compression of the conus. There is mild spinal stenosis at T12 due to epidural tumor.  Tumor in the super endplate of L2.  Tumor throughout the L4 vertebral body extending into the right pedicle. Mild pathologic fracture superior endplate. There is epidural tumor on the right extending into the right foramen and lateral recess. This could cause  impingement of the right L4 and L5 nerve roots.  Mild pathologic fracture L5. There is predominantly left-sided tumor extending into the pedicle with a mild amount of surrounding epidural tumor. Bilateral facet hypertrophy and mild spinal stenosis.  IMPRESSION: MR THORACIC SPINE IMPRESSION  Multiple areas of metastatic disease in the thoracic spine. Mild pathologic fracture T12. Negative for cord compression.  MR LUMBAR SPINE IMPRESSION  Diffuse metastatic disease in the lumbar spine and sacral spine and iliac bones bilaterally.  Mild pathologic fracture of L4 with epidural tumor on the right causing right foraminal encroachment and possible impingement of the right L5 nerve root.  Mild pathologic fracture of L5 on the left side.   Electronically Signed   By: Marlan Palau M.D.   On: 07/03/2013 18:37   Mr Lumbar Spine W Wo Contrast  07/03/2013   CLINICAL DATA:  Metastatic lung cancer.  EXAM: MRI THORACIC AND LUMBAR SPINE WITHOUT AND WITH CONTRAST  TECHNIQUE: Multiplanar and multiecho pulse sequences of the thoracic and lumbar spine were obtained without and with intravenous contrast.  CONTRAST:  12mL MULTIHANCE GADOBENATE DIMEGLUMINE 529 MG/ML IV SOLN  COMPARISON:  None.  CT abdomen pelvis June 18, 2013  FINDINGS: MR THORACIC SPINE FINDINGS  Multiple metastatic deposits in the thoracic spine. Mild pathologic fracture T12. No other fracture is identified. No cord compression is identified.  Tumor in the posterior elements of T2. Paraspinous enhancing tissue may represent tumor. No tumor in the canal.  Tumor in the right 5 rib and adjacent soft tissues  Tumor in the left 7th rib in adjacent soft tissues.  Tumor in the left pedicle of T11  Tumor throughout the T12 vertebral body with a mild pathologic fracture. There is mild ventral epidural tumor. There is tumor in the posterior elements on the left with mild posterior epidural tumor. No cord compression.  MR LUMBAR SPINE FINDINGS  Widespread metastatic  disease in the lumbar spine. There is tumor at S1. There is tumor in the iliac bones bilaterally. Pathologic fracture T12 as described above. Mild pathologic fracture of L4 and L5.  Conus medullaris terminates at mid L1. No compression of the conus. There is mild spinal stenosis at T12 due to epidural tumor.  Tumor in the super endplate of L2.  Tumor throughout the L4 vertebral body extending into the right pedicle. Mild pathologic fracture superior endplate. There is epidural tumor on the right extending into the right foramen and lateral recess. This could cause impingement of the right L4 and  L5 nerve roots.  Mild pathologic fracture L5. There is predominantly left-sided tumor extending into the pedicle with a mild amount of surrounding epidural tumor. Bilateral facet hypertrophy and mild spinal stenosis.  IMPRESSION: MR THORACIC SPINE IMPRESSION  Multiple areas of metastatic disease in the thoracic spine. Mild pathologic fracture T12. Negative for cord compression.  MR LUMBAR SPINE IMPRESSION  Diffuse metastatic disease in the lumbar spine and sacral spine and iliac bones bilaterally.  Mild pathologic fracture of L4 with epidural tumor on the right causing right foraminal encroachment and possible impingement of the right L5 nerve root.  Mild pathologic fracture of L5 on the left side.   Electronically Signed   By: Marlan Palau M.D.   On: 07/03/2013 18:37   Mr Hip Right W Wo Contrast  07/03/2013   CLINICAL DATA:  Low back pain and severe or spread that low back pain and right hip pain. Metastatic lung cancer.  EXAM: MRI OF THE RIGHT HIP WITHOUT AND WITH CONTRAST  TECHNIQUE: Multiplanar, multisequence MR imaging was performed both before and after administration of intravenous contrast.  CONTRAST:  12 cc MultiHance  COMPARISON:  CT scan dated 06/18/2013  FINDINGS: Lower lumbar metastatic lesions noted with impingement as described on lumbar MRI from 07/03/2013.  Osseous metastatic disease to the pelvis  includes a 2.1 x 1.7 cm right iliac bone lesion on image 5 of series 4, a 2.0 x 1.2 cm right iliac bone lesion with posterior cortical irregularity, suspected extraosseous extension, and adjacent edema and enhancement in the gluteus minimus muscle on image 11 of series 4 ; and scattered additional lesions of both iliac bones, the upper sacrum, and the right ischial tuberosity.  The ischial tuberosity lesion partially undermines the hamstring origination site (especially of the adductor magnus) and is associated with edema along the quadratus femoris on the right. No sciatic notch impingement or abnormal sciatic nerve asymmetry noted.  No proximal femoral metastatic lesions observed.  There are several filling defects in the urinary bladder compatible with bladder stones as shown previously. Median lobe of the prostate indents the bladder base.  IMPRESSION: 1. Multiple osseous pelvic metastatic lesions. One of the right iliac lesions has extraosseous extension along the right gluteus minimus which also demonstrates enhancement and edema. The right ischial tuberosity lesion undermines the adductor magnus origination site and is associated with extraosseous extension and edema along the right quadratus femorals muscle. 2. Lower lumbar spine metastatic lesions with extraosseous extension likely causing impingement -please see report from lumbar spine MRI. 3. Bladder stones.   Electronically Signed   By: Herbie Baltimore   On: 07/03/2013 18:56    ASSESSMENT AND PLAN: This is a very pleasant 67 years old [place] male with metastatic non-small cell lung cancer, adenocarcinoma status post systemic chemotherapy with carboplatin and Alimta followed by 9 cycles of maintenance chemotherapy with single agent Alimta but unfortunately has evidence for disease progression with multiple bone metastases as well as progression of the disease in his lung. The patient has completed palliative radiotherapy under the care of Dr.  Michell Heinrich. His Veristrat test was good. He just received his Tarceva. Patient was discussed with and also seen by Dr. Arbutus Ped.He is status post approximately 2 weeks of therapy. He will continue on Tarceva at 150 mg by mouth daily. He'll return in another 2 weeks for another symptom management visit with a repeat CBC differential and C. met. To address the increased confusion we'll obtain an MRI of his brain look for possible brain  metastasis. He'll be seen by Dr. Arbutus Ped in 2 weeks to discuss the results of that MRI scan. For constipation patient was advised to restart his MiraLAX and to take this on a regular basis as he is taking a fair amount of narcotic analgesics on a daily basis. The patient and family members voiced understanding. He was encouraged to increase his by mouth intake both of food and fluids. He will give some more consideration to his dental work versus of risk benefits of initiating Xgeva therapy as discussed with Dr. Arbutus Ped.  Laural Benes, Jovian Lembcke E, PA-C   The patient was advised to call immediately if he has any concerning symptoms in the interval. The patient voices understanding of current disease status and treatment options and is in agreement with the current care plan.  All questions were answered. The patient knows to call the clinic with any problems, questions or concerns. We can certainly see the patient much sooner if necessary.   ADDENDUM: Hematology/Oncology Attending: I had a face to face encounter with the patient. I recommended his care plan. He was started on treatment with Tarceva for the last 2 weeks and tolerating it fairly well. His back and hip pain is much better. He has skin rash mainly on the face and the chest but no significant diarrhea. I recommended for the patient to continue his current treatment was Tarceva with the same dose. For constipation he was advised to take MiraLAX on as needed basis. The patient would come back for followup visit in 2  weeks for evaluation and management any adverse effect of his treatment. He was advised to call immediately if he has any concerning symptoms in the interval. Lajuana Matte., MD 08/14/2013

## 2013-08-14 NOTE — Patient Instructions (Signed)
Continue taking Tarceva 150 mg by mouth daily Keep the appointment for the MRI of the brain to evaluate your confusion Followup in 2 weeks

## 2013-08-17 ENCOUNTER — Encounter: Payer: Self-pay | Admitting: Internal Medicine

## 2013-08-17 NOTE — Progress Notes (Signed)
[facility] [id number] 08/17/13-10/01/13

## 2013-08-20 ENCOUNTER — Telehealth: Payer: Self-pay | Admitting: *Deleted

## 2013-08-20 ENCOUNTER — Ambulatory Visit (HOSPITAL_COMMUNITY)
Admission: RE | Admit: 2013-08-20 | Discharge: 2013-08-20 | Disposition: A | Discharge status: Home / Self Care | Payer: BC Managed Care – PPO | Source: Ambulatory Visit | Attending: Physician Assistant | Admitting: Physician Assistant

## 2013-08-20 DIAGNOSIS — C3490 Malignant neoplasm of unspecified part of unspecified bronchus or lung: Secondary | ICD-10-CM | POA: Insufficient documentation

## 2013-08-20 DIAGNOSIS — M899 Disorder of bone, unspecified: Secondary | ICD-10-CM | POA: Insufficient documentation

## 2013-08-20 DIAGNOSIS — R410 Disorientation, unspecified: Secondary | ICD-10-CM

## 2013-08-20 DIAGNOSIS — I6789 Other cerebrovascular disease: Secondary | ICD-10-CM | POA: Insufficient documentation

## 2013-08-20 DIAGNOSIS — M8440XA Pathological fracture, unspecified site, initial encounter for fracture: Secondary | ICD-10-CM | POA: Insufficient documentation

## 2013-08-20 DIAGNOSIS — C801 Malignant (primary) neoplasm, unspecified: Secondary | ICD-10-CM | POA: Insufficient documentation

## 2013-08-20 DIAGNOSIS — G319 Degenerative disease of nervous system, unspecified: Secondary | ICD-10-CM | POA: Insufficient documentation

## 2013-08-20 MED ORDER — GADOBENATE DIMEGLUMINE 529 MG/ML IV SOLN
15.0000 mL | Freq: Once | INTRAVENOUS | Status: AC | PRN
  Administered 2013-08-20: 12 mL via INTRAVENOUS

## 2013-08-20 NOTE — Telephone Encounter (Signed)
Received call from [facility] @ [facility] for report of MRI Brain done today.  Printed report and gave to Dr. Arbutus Ped for review.

## 2013-08-21 IMAGING — CT CT CHEST W/ CM
2 of 5 series · 16 of 46 positions shown, 18 images · IV contrast (OMNIPAQUE)
Comparison: 02/16/2013 and 12/18/2012.

CT CHEST

CLINICAL DATA: Restaging lung cancer

CT CHEST, ABDOMEN AND PELVIS WITH CONTRAST
TECHNIQUE: Multidetector CT imaging of the chest, abdomen and
pelvis was performed following the standard protocol during bolus
administration of intravenous contrast.
Contrast: 100mL OMNIPAQUE IOHEXOL 300 MG/ML  SOLN

[Series 2: cap with st · axial · 0.81mm/px · z∈[-610,-55]mm · 13 of 125 slices shown, 15 images]
[im 7/125  soft-tissue]
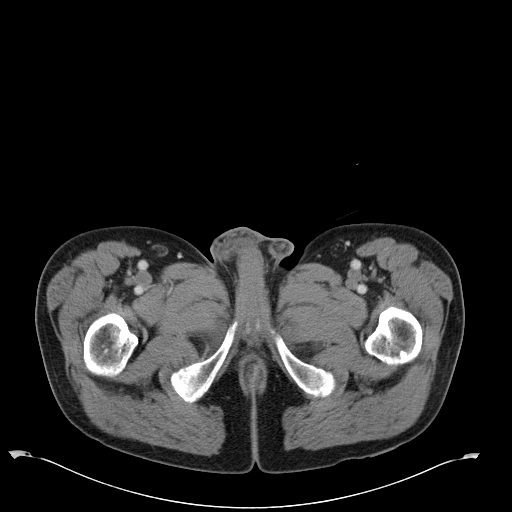
[im 7/125  bone]
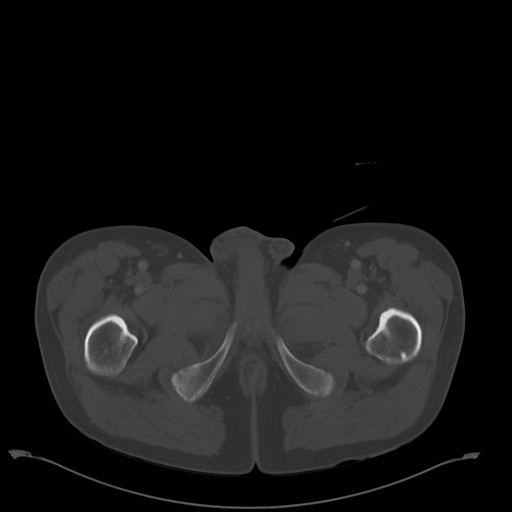
[im 20/125  soft-tissue]
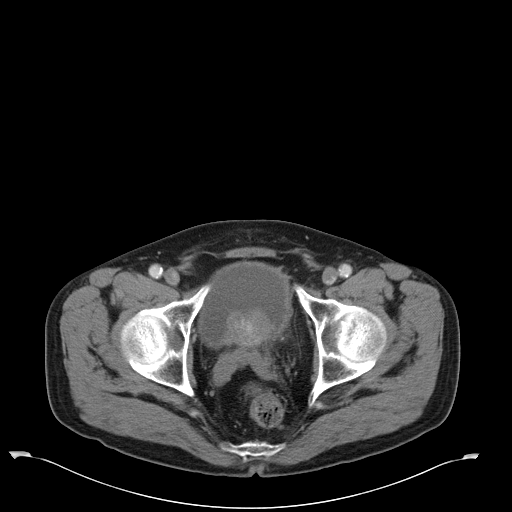
[im 27/125  soft-tissue]
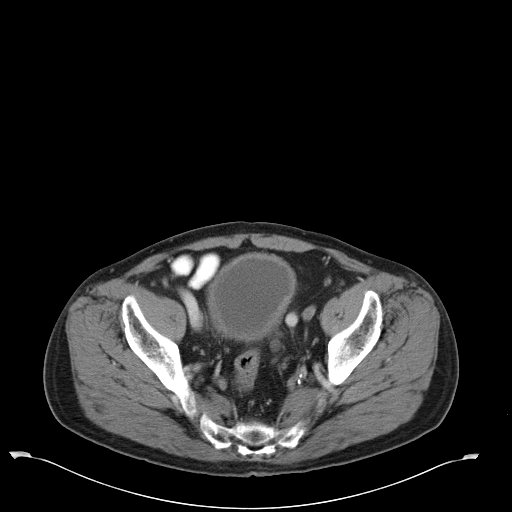
[im 33/125  soft-tissue]
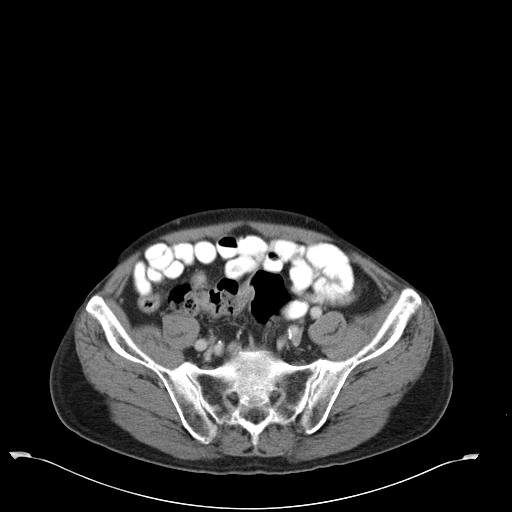
[im 46/125  soft-tissue]
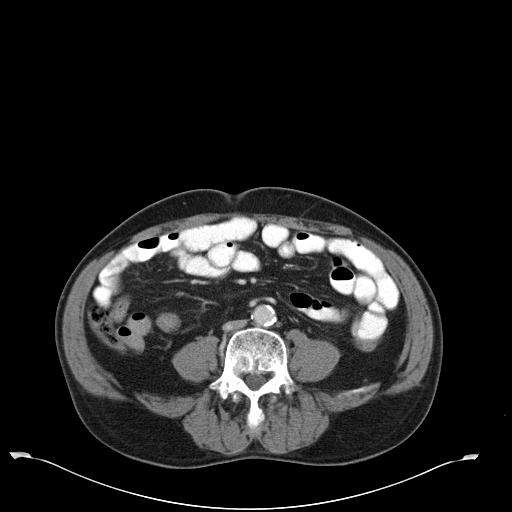
[im 53/125  soft-tissue]
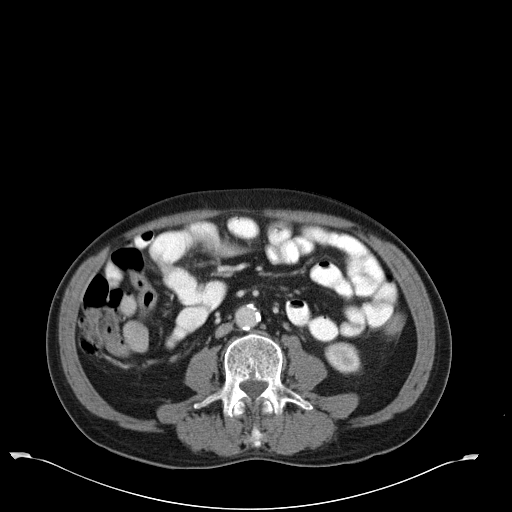
[im 66/125  soft-tissue]
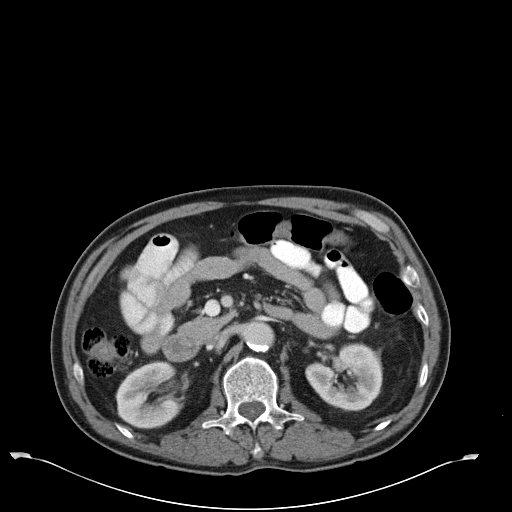
[im 72/125  soft-tissue]
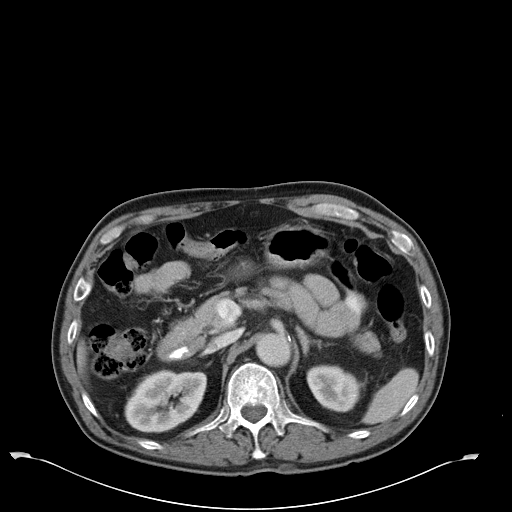
[im 79/125  soft-tissue]
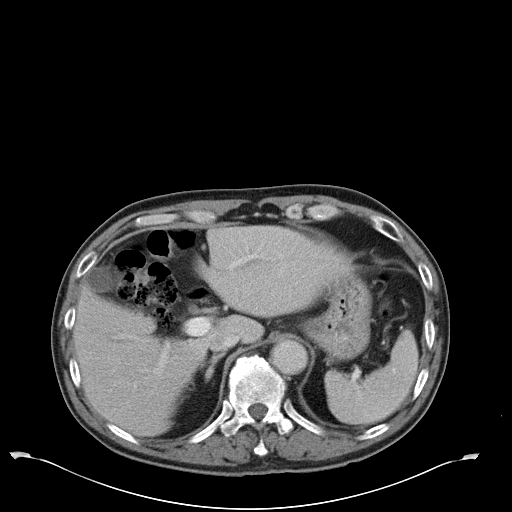
[im 79/125  bone]
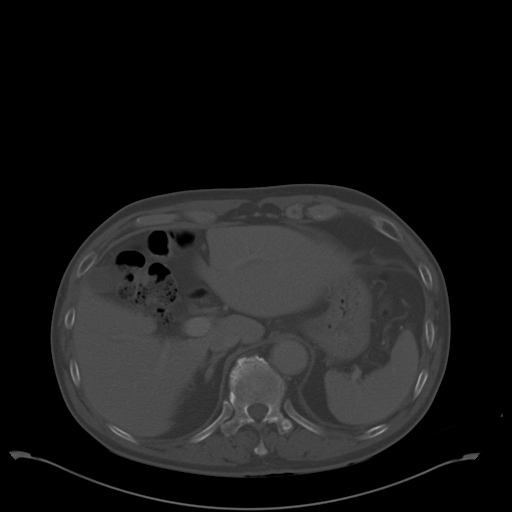
[im 92/125  soft-tissue]
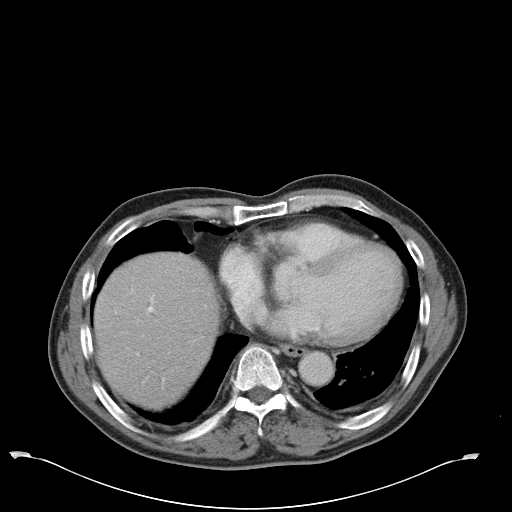
[im 98/125  soft-tissue]
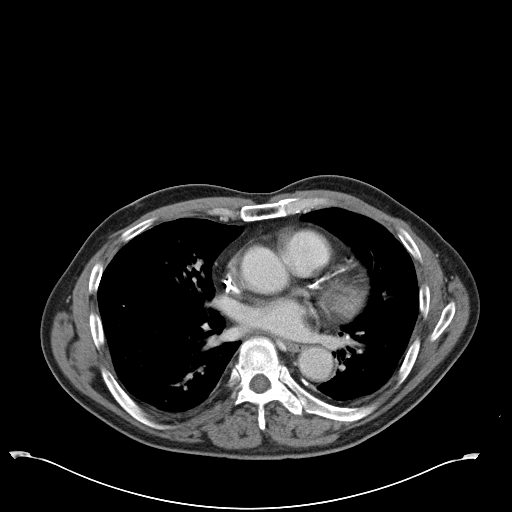
[im 105/125  soft-tissue]
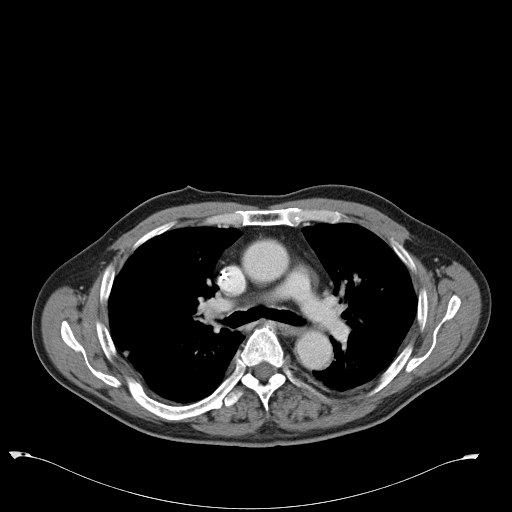
[im 118/125  soft-tissue]
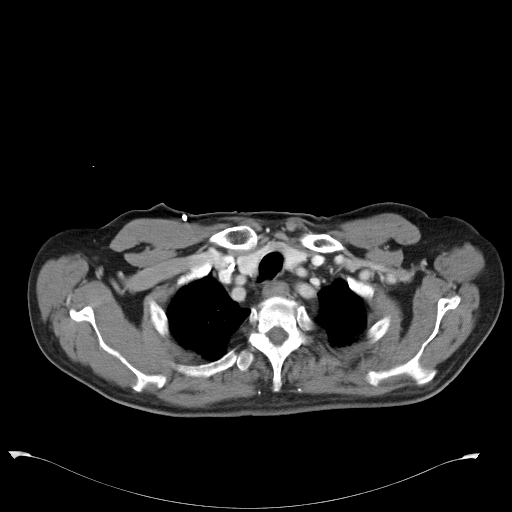

[Series 602: <mpr thick range> · coronal · 1.22mm/px · 3 of 76 slices shown]
[im 26/76  soft-tissue]
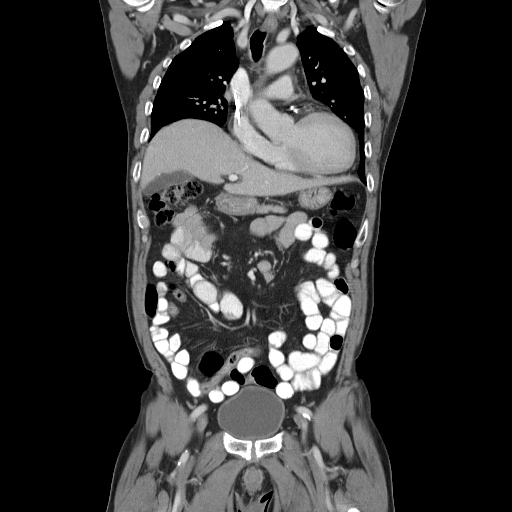
[im 34/76  soft-tissue]
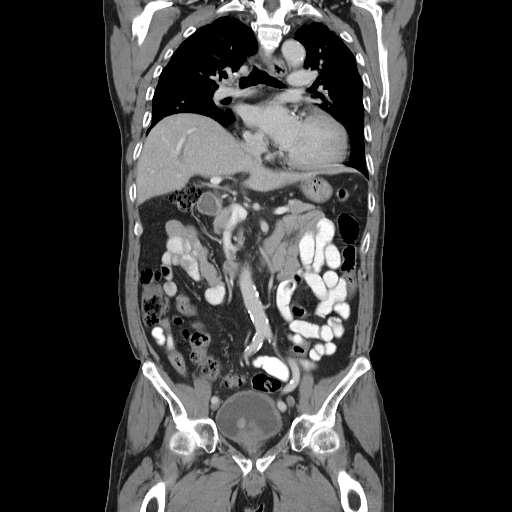
[im 42/76  soft-tissue]
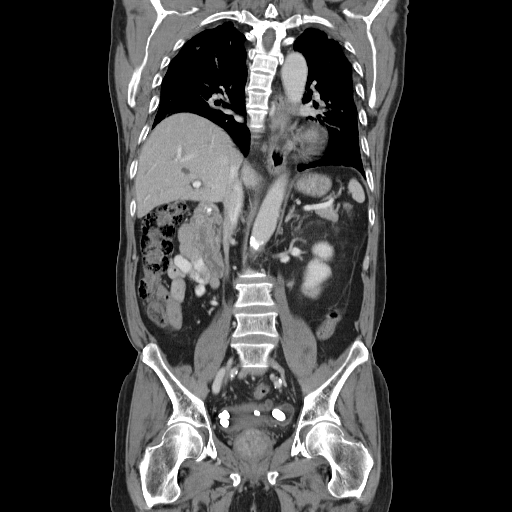

[16 of 46 positions shown; findings below may reference images not displayed]

FINDINGS: There is no pleural effusion identified.  The right
middle lobe pulmonary lesion measures 1.8 x 2.1 cm, image 32/series
4.  This is compared with 1.6 x 2.3 cm. The smaller satellite the
nodule in the right middle lobe measures 8 mm, image 30/series 4.
This is compared with 4 mm previously.  Right upper lobe nodule
measures 5 mm, image 19/series 4.  This is unchanged from previous
exam.  The superior segment of the right lower lobe nodule measures
6 mm, image 21/series 4.  This is compared with 5 mm previously.

The heart size appears normal.  There is no pericardial effusion.
Extensive calcification involving the LAD and left circumflex
coronary artery noted.  No axillary or supraclavicular adenopathy
noted.

Review of the visualized osseous structures is significant for mild
thoracic spondylosis.  No aggressive lytic or sclerotic bone
lesions noted.
IMPRESSION: 1.  Primary right middle lobe lesion is stable compared with
previous exam.
2.  The small satellite nodule within the right middle lobe
increased in size compared with the previous examination.  This now
measures 8 mm versus 4 mm previously.
3.  Other small nodules are unchanged.
4.  Atherosclerosis noted.

CT ABDOMEN AND PELVIS
FINDINGS: There is a stable low attenuation structure within the
right hepatic lobe which likely represents a cyst.  This measures 1
cm, image 43/series 2.  Mild intrahepatic biliary dilatation
appears similar to previous exam.  The gallbladder appears normal.
The common bile duct is normal caliber.  Normal appearance of the
pancreas.  The spleen is unremarkable.

The adrenal glands are both normal.  Stone within the upper pole
the left kidney measures 4 mm, image 58/series 2.  This is stable
from previous exam.  Right kidney appears normal.  There is a
multiple stones identified within the lumen of the bladder.  There
is a diverticula arising from the left posterior bladder base.
This contains a 1.9 cm stone.  The prostate gland enlargement is
again noted with mass effect upon the bladder base.

Calcified atherosclerotic disease affects the abdominal aorta.  No
aneurysm.  There is no upper abdominal adenopathy.  There is no
pelvic or inguinal adenopathy.

The stomach is normal.  The small bowel loops are unremarkable.
Normal appearance of the appendix.  The colon is unremarkable.
There are no free fluid or abnormal fluid collections within the
abdomen or pelvis.

Review of the visualized osseous structures is significant for mild
lumbar spondylosis.
IMPRESSION: 1.  No acute findings within the abdomen or pelvis.
2.  Bladder calculi and bladder diverticula.
3.  Prostate gland enlargement.

## 2013-08-23 ENCOUNTER — Telehealth: Payer: Self-pay | Admitting: Internal Medicine

## 2013-08-23 ENCOUNTER — Other Ambulatory Visit (HOSPITAL_BASED_OUTPATIENT_CLINIC_OR_DEPARTMENT_OTHER): Payer: BC Managed Care – PPO | Admitting: Lab

## 2013-08-23 ENCOUNTER — Ambulatory Visit (HOSPITAL_BASED_OUTPATIENT_CLINIC_OR_DEPARTMENT_OTHER): Payer: BC Managed Care – PPO | Admitting: Internal Medicine

## 2013-08-23 VITALS — BP 105/70 | HR 88 | Temp 97.3°F | Resp 18 | Ht 66.0 in | Wt 122.6 lb

## 2013-08-23 DIAGNOSIS — C7951 Secondary malignant neoplasm of bone: Secondary | ICD-10-CM

## 2013-08-23 DIAGNOSIS — C3430 Malignant neoplasm of lower lobe, unspecified bronchus or lung: Secondary | ICD-10-CM

## 2013-08-23 DIAGNOSIS — C3491 Malignant neoplasm of unspecified part of right bronchus or lung: Secondary | ICD-10-CM

## 2013-08-23 DIAGNOSIS — F17200 Nicotine dependence, unspecified, uncomplicated: Secondary | ICD-10-CM

## 2013-08-23 LAB — CBC WITH DIFFERENTIAL/PLATELET
BASO%: 0.7 % (ref 0.0–2.0)
EOS%: 1.4 % (ref 0.0–7.0)
Eosinophils Absolute: 0.1 10*3/uL (ref 0.0–0.5)
MCH: 29.9 pg (ref 27.2–33.4)
MCHC: 33.6 g/dL (ref 32.0–36.0)
MONO%: 5 % (ref 0.0–14.0)
NEUT%: 82.4 % — ABNORMAL HIGH (ref 39.0–75.0)
Platelets: 277 10*3/uL (ref 140–400)
RDW: 14.4 % (ref 11.0–14.6)
WBC: 8.9 10*3/uL (ref 4.0–10.3)
lymph#: 0.9 10*3/uL (ref 0.9–3.3)

## 2013-08-23 LAB — COMPREHENSIVE METABOLIC PANEL (CC13)
ALT: 47 U/L (ref 0–55)
AST: 29 U/L (ref 5–34)
BUN: 12 mg/dL (ref 7.0–26.0)
Calcium: 9.5 mg/dL (ref 8.4–10.4)
Chloride: 106 mEq/L (ref 98–109)
Creatinine: 0.8 mg/dL (ref 0.7–1.3)
Sodium: 135 mEq/L — ABNORMAL LOW (ref 136–145)
Total Bilirubin: 1.18 mg/dL (ref 0.20–1.20)
Total Protein: 7.2 g/dL (ref 6.4–8.3)

## 2013-08-23 NOTE — Progress Notes (Signed)
[facility] [phone]   [phone]  SHARED VISIT PROGRESS NOTE  Piedad Climes, PA-C [address] [place] [address]  DIAGNOSIS: Metastatic non-small cell lung cancer favoring adenocarcinoma with negative EGFR mutation, negative ALK gene translocation and negative ROS1 diagnosed in August of 2013. Veristrat test Good.  PRIOR THERAPY:  1) Status post 4 cycles of systemic chemotherapy with carboplatin and Alimta with partial response at [facility]. Last dose was given in November of 2013.  2) Maintenance chemotherapy with Alimta 500 mg/M2 started on 09/26/2012, status post 9 cycles. Last dose was given on 06/05/2013. Discontinued secondary to disease progression. 3) Palliative radiotherapy to the lower thoracic and lumbar spine under the care of Dr. Michell Heinrich, completed 07/19/2013   CURRENT THERAPY:    Tarceva 150 mg by mouth daily. Status post approximately 4 weeks of therapy    DISEASE STAGE: Stage IV  CHEMOTHERAPY INTENT: Palliative  CURRENT # OF CHEMOTHERAPY CYCLES: 1  CURRENT ANTIEMETICS: Zofran and Compazine  CURRENT SMOKING STATUS: Former smoker, quit 05/11/2012  ORAL CHEMOTHERAPY AND CONSENT: Tarceva and consent was signed today. CURRENT BISPHOSPHONATES USE: None  PAIN MANAGEMENT: 4/10  NARCOTICS INDUCED CONSTIPATION: None  LIVING WILL AND CODE STATUS: ?   INTERVAL HISTORY: [place] Sumida 67 y.o. male returns to the clinic today for followup visit accompanied by his daughter, Almira Coaster and his [place] interpreter. The patient is rating his treatment was Tarceva fairly well with no significant adverse effects except for the mild skin rash on the face. He start smoking again after he new that his daughter Almira Coaster was recently diagnosed with appendiceal  Carcinoma and he is under a lot of stress. He denied having any significant back pain. He is considered for treatment with Riv[facility] Barbara and was seen in the past by Dr. Kristin Bruins  and apparently he recommended extensive dental work to be done. He is scheduled to see an oral surgeon soon. The patient is interested in dental implants. He understands that all dental work would need to be done and well-healed prior to starting Xgeva. He denied having any significant chest pain, shortness of breath, cough or hemoptysis. The patient denied having any significant weight loss or night sweats. The patient has no nausea or vomiting.he had MRI of the brain performed recently and he is here for evaluation and discussion of his imaging results.  MEDICAL HISTORY: Past Medical History  Diagnosis Date  . Tobacco use   . Lung mass   . BPH (benign prostatic hyperplasia)   . Lung cancer   . Coronary artery disease     ALLERGIES:  has No Known Allergies.  MEDICATIONS:  Current Outpatient Prescriptions  Medication Sig Dispense Refill  . clindamycin (CLINDAGEL) 1 % gel Apply topically 2 (two) times daily.  30 g  0  . erlotinib (TARCEVA) 150 MG tablet Take 1 tablet (150 mg total) by mouth daily. Take on an empty stomach 1 hour before meals or 2 hours after.  30 tablet  2  . lidocaine-prilocaine (EMLA) cream Apply topically as needed. Cover with plastic wrap 1.5 hours before chemo.  30 g  1  . Multiple Vitamin (MULTIVITAMIN) tablet Take 1 tablet by mouth daily.      . OxyCODONE (OXYCONTIN) 20 mg T12A 12 hr tablet Take 1 tablet (20 mg total) by mouth every 12 (twelve) hours.  60 tablet  0  . oxyCODONE-acetaminophen (PERCOCET/ROXICET) 5-325 MG per tablet Take 1 tablet by mouth every  6 (six) hours as needed for pain. May take 2 tablets PO q 6 hours for severe pain - Do not take with Tylenol as this tablet already contains tylenol  60 tablet  0  . polyethylene glycol (MIRALAX / GLYCOLAX) packet Take 17 g by mouth daily.      . prochlorperazine (COMPAZINE) 10 MG tablet Take 5 mg by mouth every 6 (six) hours as needed. Take 0.5 tablets (5 mg total) by mouth every 6 (six) hours as needed for Nausea.       . Saw Palmetto, Serenoa repens, (SAW PALMETTO PO) Take 1 capsule by mouth daily.      Marland Kitchen terazosin (HYTRIN) 2 MG capsule Take 4 mg by mouth at bedtime.       No current facility-administered medications for this visit.    SURGICAL HISTORY:  Past Surgical History  Procedure Laterality Date  . Video bronchoscopy  05/31/2012    Procedure: VIDEO BRONCHOSCOPY WITH FLUORO;  Surgeon: Nyoka Cowden, MD;  Location: [facility];  Service: Endoscopy;  Laterality: Bilateral;    REVIEW OF SYSTEMS:  Constitutional: positive for anorexia, fatigue and weight loss Eyes: negative Ears, nose, mouth, throat, and face: negative Respiratory: negative Cardiovascular: negative Gastrointestinal: positive for constipation Genitourinary:negative Integument/breast: negative Hematologic/lymphatic: negative Musculoskeletal:positive for bone pain, muscle weakness and improved Neurological: positive for increased confusion Behavioral/Psych: negative Endocrine: negative Allergic/Immunologic: negative   PHYSICAL EXAMINATION: General appearance: alert, cooperative, fatigued and no distress Head: Normocephalic, without obvious abnormality, atraumatic Neck: no adenopathy, no JVD, supple, symmetrical, trachea midline and thyroid not enlarged, symmetric, no tenderness/mass/nodules Lymph nodes: Cervical, supraclavicular, and axillary nodes normal. Resp: clear to auscultation bilaterally Back: symmetric, no curvature. ROM normal. No CVA tenderness. Cardio: regular rate and rhythm, S1, S2 normal, no murmur, click, rub or gallop GI: soft, non-tender; bowel sounds normal; no masses,  no organomegaly Extremities: extremities normal, atraumatic, no cyanosis or edema Neurologic: Alert and oriented X 3, normal strength and tone. Normal symmetric reflexes. Normal coordination and gait  ECOG PERFORMANCE STATUS: 2 - Symptomatic, <50% confined to bed  Blood pressure 105/70, pulse 88, temperature 97.3 F (36.3 C),  temperature source Oral, resp. rate 18, height 5\' 6"  (1.676 m), weight 122 lb 9.6 oz (55.611 kg).  LABORATORY DATA: Lab Results  Component Value Date   WBC 8.9 08/23/2013   HGB 12.5* 08/23/2013   HCT 37.2* 08/23/2013   MCV 89.0 08/23/2013   PLT 277 08/23/2013      Chemistry      Component Value Date/Time   NA 135* 08/23/2013 1003   NA 137 06/18/2013 1712   K 3.6 08/23/2013 1003   K 3.6 06/18/2013 1712   CL 100 06/18/2013 1712   CL 109* 04/03/2013 1155   CO2 20* 08/23/2013 1003   CO2 23 06/18/2013 1712   BUN 12.0 08/23/2013 1003   BUN 12 06/18/2013 1712   CREATININE 0.8 08/23/2013 1003   CREATININE 0.80 06/18/2013 1712   CREATININE 0.65 06/02/2012 1611      Component Value Date/Time   CALCIUM 9.5 08/23/2013 1003   CALCIUM 10.0 06/18/2013 1712   ALKPHOS 131 08/23/2013 1003   ALKPHOS 110 06/18/2013 1712   AST 29 08/23/2013 1003   AST 34 06/18/2013 1712   ALT 47 08/23/2013 1003   ALT 38 06/18/2013 1712   BILITOT 1.18 08/23/2013 1003   BILITOT 0.6 06/18/2013 1712       RADIOGRAPHIC STUDIES: EXAM:  MRI HEAD WITHOUT AND WITH CONTRAST  TECHNIQUE:  Multiplanar, multiecho pulse  sequences of the brain and surrounding  structures were obtained according to standard protocol without and  with intravenous contrast  CONTRAST: 12mL MULTIHANCE GADOBENATE DIMEGLUMINE 529 MG/ML IV SOLN  COMPARISON: None.  FINDINGS:  No evidence for acute infarction, hemorrhage, mass lesion,  hydrocephalus, or extra-axial fluid. Mild to moderate premature for  age cerebral and cerebellar atrophy is noted. Mild subcortical and  periventricular T2 and FLAIR hyperintensities, likely chronic  microvascular ischemic change.  Flow voids are maintained throughout the carotid, basilar, and  vertebral arteries. There are no areas of chronic hemorrhage.  Post infusion, no abnormal enhancement of the brain or meninges.  Possible metastatic lesion to the right temporo-occipital region  (image 22 series 10). Complete  replacement of the C3 vertebral body,  with early developing pathologic fracture, without cord compression  or significant epidural tumor.  Negative orbits. Chronic sinus disease in the ethmoid and maxillary  regions. Mild bilateral mastoid fluid likely effusions.  IMPRESSION:  No intracranial metastatic deposits are seen. Moderate atrophy and  chronic microvascular ischemic change.  Subcentimeter right calvarial osseous lesion, likely metastasis.  Complete replacement of the C3 vertebral body with early developing  pathologic fracture, but no visible cord compression.  These results will be called to the ordering clinician or  representative by the Radiologist Assistant, and communication  documented in the PACS Dashboard.  Electronically Signed  By: Davonna Belling M.D.  On: 08/20/2013 11:27   ASSESSMENT AND PLAN: This is a very pleasant 67 years old [place] male with metastatic non-small cell lung cancer, adenocarcinoma status post systemic chemotherapy with carboplatin and Alimta followed by 9 cycles of maintenance chemotherapy with single agent Alimta but unfortunately has evidence for disease progression with multiple bone metastases as well as progression of the disease in his lung. The patient has completed palliative radiotherapy under the care of Dr. Michell Heinrich. His Veristrat test was good. He just received his Tarceva.  The patient is tolerating his treatment with Tarceva fairly well with no significant adverse effects. His MRI of the brain showed no evidence for metastatic disease to the brain but there was complete replacement of the C3 vertebral body with early developing pathologic fractures but no cord compression. I discussed the MRI results with the patient and his family. I already called Dr. Michell Heinrich with the results and she would see the patient soon for reevaluation and palliative radiotherapy to this area. I advised the patient to continue his current treatment with  Tarceva. He would come back for followup visit in one month with repeat CT scan of the chest, abdomen and pelvis for restaging of his disease. I strongly encouraged the patient to quit smoking again and offered him a smoke cessation program.  He was advised to call immediately if he has any concerning symptoms in the interval. The patient voices understanding of current disease status and treatment options and is in agreement with the current care plan.  All questions were answered. The patient knows to call the clinic with any problems, questions or concerns. We can certainly see the patient much sooner if necessary.  I spent 20 minutes counseling the patient face to face. The total time spent in the appointment was 30 minutes.  Lajuana Matte., MD 08/23/2013

## 2013-08-23 NOTE — Telephone Encounter (Signed)
gv and printed appt sched and avs for pt for Dec...gv pt barium °

## 2013-08-25 ENCOUNTER — Encounter: Payer: Self-pay | Admitting: Internal Medicine

## 2013-08-25 NOTE — Patient Instructions (Signed)
CURRENT THERAPY:    Tarceva 150 mg by mouth daily. Status post approximately 4 weeks of therapy    DISEASE STAGE: Stage IV  CHEMOTHERAPY INTENT: Palliative  CURRENT # OF CHEMOTHERAPY CYCLES: 1  CURRENT ANTIEMETICS: Zofran and Compazine  CURRENT SMOKING STATUS: Former smoker, quit 05/11/2012  ORAL CHEMOTHERAPY AND CONSENT: Tarceva and consent was signed today. CURRENT BISPHOSPHONATES USE: None  PAIN MANAGEMENT: 4/10  NARCOTICS INDUCED CONSTIPATION: None  LIVING WILL AND CODE STATUS: ?

## 2013-08-31 ENCOUNTER — Ambulatory Visit
Admission: RE | Admit: 2013-08-31 | Discharge: 2013-08-31 | Disposition: A | Discharge status: Home / Self Care | Payer: BC Managed Care – PPO | Source: Ambulatory Visit | Attending: Radiation Oncology | Admitting: Radiation Oncology

## 2013-08-31 ENCOUNTER — Other Ambulatory Visit: Payer: Self-pay | Admitting: Physician Assistant

## 2013-08-31 VITALS — BP 117/72 | HR 68 | Temp 99.6°F | Resp 20 | Wt 128.0 lb

## 2013-08-31 DIAGNOSIS — C3491 Malignant neoplasm of unspecified part of right bronchus or lung: Secondary | ICD-10-CM

## 2013-08-31 NOTE — Progress Notes (Signed)
Pt here w/family member and interpreter. Pt denies pain, fatigue, loss of appetite, cough, SOB, back pain. Family member states he had hematuria 3 weeks ago, stopped taking Percocet which pt did. Pt has noticed "small amount of blood in his urine x 2-3 days". He denies dysuria, frequency, urgency, other voiding issues. Family states pt has seen his PCP and a urologist in past for prostate problems.

## 2013-08-31 NOTE — Progress Notes (Signed)
Department of Radiation Oncology  Phone:  [phone] Fax:        [phone]   Name: Thomas May MRN: [id number]  DOB: Feb 23, 1946  Date: 08/31/2013  Follow Up Visit Note  Diagnosis: Metastatic lung cancer  Summary and Interval since last radiation: 1 month since   Interval History: Tremar presents today for routine followup.  He by his son and an interpreter. His pain is much less. He is ambulating up and down stairs without any problems. His nausea and diarrhea have resolved. He did have a recent brain MRI secondary to headaches which showed a lesion at C3. The early pathologic fracture was noted. He was referred to me for possible radiation to this area. He reports no pain in this area. He is voiding normally although he has had some recent hematuria.  Allergies: No Known Allergies  Medications:  Current Outpatient Prescriptions  Medication Sig Dispense Refill  . clindamycin (CLINDAGEL) 1 % gel Apply topically 2 (two) times daily.  30 g  0  . erlotinib (TARCEVA) 150 MG tablet Take 1 tablet (150 mg total) by mouth daily. Take on an empty stomach 1 hour before meals or 2 hours after.  30 tablet  2  . lidocaine-prilocaine (EMLA) cream Apply topically as needed. Cover with plastic wrap 1.5 hours before chemo.  30 g  1  . Multiple Vitamin (MULTIVITAMIN) tablet Take 1 tablet by mouth daily.      . OxyCODONE (OXYCONTIN) 20 mg T12A 12 hr tablet Take 1 tablet (20 mg total) by mouth every 12 (twelve) hours.  60 tablet  0  . oxyCODONE-acetaminophen (PERCOCET/ROXICET) 5-325 MG per tablet Take 1 tablet by mouth every 6 (six) hours as needed for pain. May take 2 tablets PO q 6 hours for severe pain - Do not take with Tylenol as this tablet already contains tylenol  60 tablet  0  . polyethylene glycol (MIRALAX / GLYCOLAX) packet Take 17 g by mouth daily.      . prochlorperazine (COMPAZINE) 10 MG tablet Take 5 mg by mouth every 6 (six) hours as needed. Take 0.5 tablets (5 mg total) by mouth every  6 (six) hours as needed for Nausea.      . Saw Palmetto, Serenoa repens, (SAW PALMETTO PO) Take 1 capsule by mouth daily.      Marland Kitchen terazosin (HYTRIN) 2 MG capsule Take 4 mg by mouth at bedtime.       No current facility-administered medications for this encounter.    Physical Exam:  Filed Vitals:   08/31/13 1331  BP: 117/72  Pulse: 68  Temp: 99.6 F (37.6 C)  Resp: 20   his cranial nerves II through XII are tested and intact. He has 5 out of 5 strength in his bilateral upper and lower extremities.  Spine Instability Neoplastic Score (SINS): SINS Component Description Score  Location Junctional (Occ-C2, C7-T2, T11-L1, L5-S1) Mobile (C3-6, L2-4) Semirigid (T3-10) Rigid (S2-5) 3 2 1  0  Pain Yes Occasional, non-mechanical No 3 1 0  Bone Lesion Lytic Mixed Blastic 2 1 0  Alignment Subluxation/Translation De Novo deformity Normal 4 2 0  Vertebral Body >50% collapse <50% collapse No collapse >50% VB involved None of above 3 2 1  0  Posterolateral Involvment Bilateral Unilateral 3 1   Tallied Score from 6 Components: Stable Potentially Unstable Unstable  0-6 7-12 13-18   SINS Score: 6   Fisher CG, et al. A novel classification system for spinal instability in neoplastic disease: an evidence-based approach  and expert consensus from the Spine Oncology Study Group. Spine  35(22):E1221-9, 2010    IMPRESSION: Thomas May is a 67 y.o. male with metastatic non-small cell lung cancer and new lesion at C3.  PLAN:  I spoke to Mr. Thomas May and his family. I spoke to neurosurgery regarding the stability of this lesion and possible upfront surgery. The neurosurgeon on call felt it was stable enough to proceed on with definitive radiation at this time. We'll set him up for simulation next week. We have discussed the possibility of esophagitis and skin redness. He has signed informed consent. I encouraged them to head to the emergency room with any sudden onset neck pain. We discussed the  possibility of transection of his cord and the effect that would have given the lesion in this area.    Lurline Hare, MD

## 2013-08-31 NOTE — Telephone Encounter (Signed)
Rx request to pharmacy/SLS  

## 2013-09-05 ENCOUNTER — Ambulatory Visit
Admission: RE | Admit: 2013-09-05 | Discharge: 2013-09-05 | Disposition: A | Discharge status: Home / Self Care | Payer: Medicare Other | Source: Ambulatory Visit | Attending: Radiation Oncology | Admitting: Radiation Oncology

## 2013-09-05 DIAGNOSIS — Z510 Encounter for antineoplastic radiation therapy: Secondary | ICD-10-CM | POA: Insufficient documentation

## 2013-09-05 DIAGNOSIS — R197 Diarrhea, unspecified: Secondary | ICD-10-CM | POA: Insufficient documentation

## 2013-09-05 DIAGNOSIS — C7951 Secondary malignant neoplasm of bone: Secondary | ICD-10-CM | POA: Insufficient documentation

## 2013-09-05 DIAGNOSIS — C3490 Malignant neoplasm of unspecified part of unspecified bronchus or lung: Secondary | ICD-10-CM | POA: Insufficient documentation

## 2013-09-05 DIAGNOSIS — R64 Cachexia: Secondary | ICD-10-CM | POA: Insufficient documentation

## 2013-09-05 DIAGNOSIS — R070 Pain in throat: Secondary | ICD-10-CM | POA: Insufficient documentation

## 2013-09-05 DIAGNOSIS — C3491 Malignant neoplasm of unspecified part of right bronchus or lung: Secondary | ICD-10-CM

## 2013-09-05 DIAGNOSIS — M25559 Pain in unspecified hip: Secondary | ICD-10-CM | POA: Insufficient documentation

## 2013-09-05 NOTE — Progress Notes (Addendum)
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  Identity was confirmed.  All relevant records and images related to the planned course of therapy were reviewed.  Then, the patient was positioned in a stable reproducible clinical set-up for radiation therapy.  CT images were obtained.  An isocenter was placed. Skin markings were placed.  The CT images were loaded into the planning software where the target and avoidance structures were contoured.  The radiation prescription was entered and confirmed. The patient was discharged in stable condition and tolerated simulation well.    TREATMENT PLANNING NOTE:  Treatment planning then occurred. I have requested : MLC's, isodose plan, basic dose calculation  A total of 3 medically necessary complex treatment devices were formed under my direct supervision and with my approval.

## 2013-09-12 ENCOUNTER — Emergency Department (HOSPITAL_COMMUNITY)
Admission: EM | Admit: 2013-09-12 | Discharge: 2013-09-12 | Disposition: A | Discharge status: Home / Self Care | Payer: Medicare Other | Attending: Emergency Medicine | Admitting: Emergency Medicine

## 2013-09-12 ENCOUNTER — Telehealth: Payer: Self-pay | Admitting: Medical Oncology

## 2013-09-12 ENCOUNTER — Telehealth: Payer: Self-pay | Admitting: Dietician

## 2013-09-12 DIAGNOSIS — R5381 Other malaise: Secondary | ICD-10-CM | POA: Insufficient documentation

## 2013-09-12 DIAGNOSIS — Z87891 Personal history of nicotine dependence: Secondary | ICD-10-CM | POA: Insufficient documentation

## 2013-09-12 DIAGNOSIS — R000 Tachycardia, unspecified: Secondary | ICD-10-CM | POA: Insufficient documentation

## 2013-09-12 DIAGNOSIS — R42 Dizziness and giddiness: Secondary | ICD-10-CM | POA: Insufficient documentation

## 2013-09-12 DIAGNOSIS — C801 Malignant (primary) neoplasm, unspecified: Secondary | ICD-10-CM | POA: Diagnosis not present

## 2013-09-12 DIAGNOSIS — I2510 Atherosclerotic heart disease of native coronary artery without angina pectoris: Secondary | ICD-10-CM | POA: Diagnosis not present

## 2013-09-12 DIAGNOSIS — Z792 Long term (current) use of antibiotics: Secondary | ICD-10-CM | POA: Insufficient documentation

## 2013-09-12 DIAGNOSIS — E860 Dehydration: Secondary | ICD-10-CM | POA: Insufficient documentation

## 2013-09-12 DIAGNOSIS — R197 Diarrhea, unspecified: Secondary | ICD-10-CM | POA: Diagnosis present

## 2013-09-12 DIAGNOSIS — Z79899 Other long term (current) drug therapy: Secondary | ICD-10-CM | POA: Diagnosis not present

## 2013-09-12 DIAGNOSIS — Z87448 Personal history of other diseases of urinary system: Secondary | ICD-10-CM | POA: Diagnosis not present

## 2013-09-12 DIAGNOSIS — C3490 Malignant neoplasm of unspecified part of unspecified bronchus or lung: Secondary | ICD-10-CM | POA: Diagnosis not present

## 2013-09-12 DIAGNOSIS — M8430XS Stress fracture, unspecified site, sequela: Secondary | ICD-10-CM

## 2013-09-12 LAB — BASIC METABOLIC PANEL
BUN: 20 mg/dL (ref 6–23)
CO2: 23 mEq/L (ref 19–32)
Chloride: 98 mEq/L (ref 96–112)
Creatinine, Ser: 0.9 mg/dL (ref 0.50–1.35)
GFR calc Af Amer: >90 mL/min (ref >90)
Glucose, Bld: 115 mg/dL — ABNORMAL HIGH (ref 70–99)
Potassium: 3.4 mEq/L — ABNORMAL LOW (ref 3.5–5.1)
Sodium: 132 mEq/L — ABNORMAL LOW (ref 135–145)

## 2013-09-12 LAB — CBC WITH DIFFERENTIAL/PLATELET
Basophils Relative: 0 % (ref 0–1)
Eosinophils Absolute: 0.1 10*3/uL (ref 0.0–0.7)
HCT: 35.3 % — ABNORMAL LOW (ref 39.0–52.0)
Hemoglobin: 12.4 g/dL — ABNORMAL LOW (ref 13.0–17.0)
MCH: 29.7 pg (ref 26.0–34.0)
MCHC: 35.1 g/dL (ref 30.0–36.0)
Monocytes Absolute: 0.7 10*3/uL (ref 0.1–1.0)
Monocytes Relative: 6 % (ref 3–12)
Neutro Abs: 9 10*3/uL — ABNORMAL HIGH (ref 1.7–7.7)
RBC: 4.17 MIL/uL — ABNORMAL LOW (ref 4.22–5.81)

## 2013-09-12 MED ORDER — HEPARIN SOD (PORK) LOCK FLUSH 100 UNIT/ML IV SOLN
500.0000 [IU] | Freq: Once | INTRAVENOUS | Status: AC
  Administered 2013-09-12: 500 [IU]
  Filled 2013-09-12: qty 5

## 2013-09-12 MED ORDER — SODIUM CHLORIDE 0.9 % IV BOLUS (SEPSIS)
1000.0000 mL | Freq: Once | INTRAVENOUS | Status: AC
  Administered 2013-09-12: 1000 mL via INTRAVENOUS

## 2013-09-12 MED ORDER — SODIUM CHLORIDE 0.9 % IJ SOLN
INTRAMUSCULAR | Status: AC
  Administered 2013-09-12: 19:00:00
  Filled 2013-09-12: qty 10

## 2013-09-12 MED ORDER — OXYCODONE-ACETAMINOPHEN 5-325 MG PO TABS: 1.0000 | ORAL_TABLET | Freq: Four times a day (QID) | ORAL | Status: DC | PRN

## 2013-09-12 MED ORDER — OXYCODONE HCL ER 20 MG PO T12A: 20.0000 mg | EXTENDED_RELEASE_TABLET | Freq: Two times a day (BID) | ORAL | Status: DC

## 2013-09-12 NOTE — ED Notes (Addendum)
Pt c/o diarrhea times 3 days. Pt has loss of appetite. Family called oncology MD and was told to give Imodium, they did but didn't help. Pt had 5 watery BM in last 24 hours. Pt denies abd pain but very intestines are hyperactive. Pt has stage 4 lung cancer. Pt last radiation ended on 10/8

## 2013-09-12 NOTE — Telephone Encounter (Signed)
Brief Outpatient Oncology Nutrition Note  Patient has been identified to be at risk on malnutrition screen.  Wt Readings from Last 10 Encounters:  08/31/13 128 lb (58.06 kg)  08/23/13 122 lb 9.6 oz (55.611 kg)  08/09/13 128 lb 14.4 oz (58.469 kg)  07/25/13 131 lb 1.6 oz (59.467 kg)  07/17/13 132 lb 9.6 oz (60.147 kg)  07/10/13 134 lb 9.6 oz (61.054 kg)  07/10/13 135 lb (61.236 kg)  06/28/13 139 lb 8 oz (63.277 kg)  06/27/13 138 lb 8 oz (62.823 kg)  06/26/13 138 lb 4.8 oz (62.732 kg)    DIAGNOSIS: Metastatic non-small cell lung cancer favoring adenocarcinoma with negative EGFR mutation, negative ALK gene translocation and negative ROS1 diagnosed in August of 2013.   Called patient due to weight changes above.  Patient is unavailable.  Contact information for [facility] provided.  Oran Rein, RD, LDN

## 2013-09-12 NOTE — Telephone Encounter (Signed)
Thomas May -daughter called to report that  her dad is having watery diarrhea 4-5 times a day since sat. "He 's afraid to eat or drink because it runs right through him". He has been taking up to 4 imodium /day ( she said that is the max he can take on bottle intructions). I did speak to pt via interpretor and his voice is very weak . He acknowledges being very weak and dizzy. He acknowledged he can get to his car and his daughter can take him to [facility] ED. I verified with Thomas May that she will get her dad and take him to ED.

## 2013-09-12 NOTE — ED Notes (Signed)
VS updated and stable Ice water given to patient

## 2013-09-12 NOTE — ED Notes (Signed)
Pt's daughter sts Pt took a stool softener on Saturday.  Pt denies pain.

## 2013-09-12 NOTE — ED Provider Notes (Signed)
CSN: [id number]     Arrival date & time 09/12/13  1556 History   First MD Initiated Contact with Patient 09/12/13 1736     Chief Complaint  Patient presents with  . Diarrhea   (Consider location/radiation/quality/duration/timing/severity/associated sxs/prior Treatment) HPI Comments: 67 yo male with lung CA with mets, Dr Shirline Frees follows, radiation scheduled for tomoorw, on Tarceva presents with recurrent diarrhea for 3 days. No recent abx or travel.  No blood.  5 in 24 hrs.  No abd pain.  Patient is a 67 y.o. male presenting with diarrhea. The history is provided by the patient.  Diarrhea Quality:  Watery Associated symptoms: no abdominal pain, no chills, no fever, no headaches and no vomiting     Past Medical History  Diagnosis Date  . Tobacco use   . Lung mass   . BPH (benign prostatic hyperplasia)   . Lung cancer   . Coronary artery disease   . Radiation 07/04/13-07/19/13    Lumbosacral spine 30 Gy   Past Surgical History  Procedure Laterality Date  . Video bronchoscopy  05/31/2012    Procedure: VIDEO BRONCHOSCOPY WITH FLUORO;  Surgeon: Nyoka Cowden, MD;  Location: [facility];  Service: Endoscopy;  Laterality: Bilateral;   Family History  Problem Relation Age of Onset  . Ulcers Other   . Coronary artery disease Neg Hx    History  Substance Use Topics  . Smoking status: Former Smoker -- 0.50 packs/day for 40 years    Types: Cigarettes    Quit date: 05/11/2012  . Smokeless tobacco: Never Used     Comment: Patient is using Nicotine patches.  . Alcohol Use: 1.5 oz/week    3 drink(s) per week     Comment: three times weekly    Review of Systems  Constitutional: Positive for fatigue. Negative for fever and chills.  HENT: Negative for congestion.   Eyes: Negative for visual disturbance.  Respiratory: Negative for shortness of breath.   Cardiovascular: Negative for chest pain.  Gastrointestinal: Positive for diarrhea. Negative for vomiting and abdominal pain.   Genitourinary: Negative for dysuria and flank pain.  Musculoskeletal: Negative for back pain, neck pain and neck stiffness.  Skin: Negative for rash.  Neurological: Positive for light-headedness. Negative for headaches.    Allergies  Review of patient's allergies indicates no known allergies.  Home Medications   Current Outpatient Rx  Name  Route  Sig  Dispense  Refill  . clindamycin (CLINDAGEL) 1 % gel      APPLY TO THE AFFECTED AREA TWICE DAILY TOPICALLY   30 g   0   . erlotinib (TARCEVA) 150 MG tablet   Oral   Take 1 tablet (150 mg total) by mouth daily. Take on an empty stomach 1 hour before meals or 2 hours after.   30 tablet   2   . Multiple Vitamin (MULTIVITAMIN) tablet   Oral   Take 1 tablet by mouth daily.         . OxyCODONE (OXYCONTIN) 20 mg T12A 12 hr tablet   Oral   Take 1 tablet (20 mg total) by mouth every 12 (twelve) hours.   60 tablet   0   . oxyCODONE-acetaminophen (PERCOCET/ROXICET) 5-325 MG per tablet   Oral   Take 1 tablet by mouth every 6 (six) hours as needed. May take 2 tablets PO q 6 hours for severe pain - Do not take with Tylenol as this tablet already contains tylenol   60 tablet   0   .  Saw Palmetto, Serenoa repens, (SAW PALMETTO PO)   Oral   Take 1 capsule by mouth daily.         Marland Kitchen terazosin (HYTRIN) 2 MG capsule      TAKE 1 CAPSULE BY MOUTH AT BEDTIME   30 capsule   2    BP 113/82  Pulse 119  Temp(Src) 98.1 F (36.7 C) (Oral)  Resp 14  SpO2 98% Physical Exam  Nursing note and vitals reviewed. Constitutional: He is oriented to person, place, and time. He appears well-developed and well-nourished.  HENT:  Head: Normocephalic and atraumatic.  Dry mm  Eyes: Conjunctivae are normal. Right eye exhibits no discharge. Left eye exhibits no discharge.  Neck: Normal range of motion. Neck supple. No tracheal deviation present.  Cardiovascular: Regular rhythm.  Tachycardia present.   Pulmonary/Chest: Effort normal and breath  sounds normal.  Abdominal: Soft. He exhibits no distension. There is no tenderness. There is no guarding.  Musculoskeletal: He exhibits no edema.  Neurological: He is alert and oriented to person, place, and time.  Skin: Skin is warm. No rash noted.  Psychiatric: He has a normal mood and affect.    ED Course  Procedures (including critical care time) Labs Review Labs Reviewed  BASIC METABOLIC PANEL - Abnormal; Notable for the following:    Sodium 132 (*)    Potassium 3.4 (*)    Glucose, Bld 115 (*)    GFR calc non Af Amer 88 (*)    All other components within normal limits  CBC WITH DIFFERENTIAL - Abnormal; Notable for the following:    WBC 11.3 (*)    RBC 4.17 (*)    Hemoglobin 12.4 (*)    HCT 35.3 (*)    Neutrophils Relative % 80 (*)    Neutro Abs 9.0 (*)    All other components within normal limits   Imaging Review No results found.  EKG Interpretation   None       MDM   1. Diarrhea   2. Dehydration    Clinically dehydrated.  No abdo pain. Plan for labs, IV and po fluids. Non toxic appearing. Pt improved in ED.   Results and differential diagnosis were discussed with the patient. Close follow up outpatient was discussed, patient comfortable with the plan.   Diagnosis: Diarrhea, dehydration     Enid Skeens, MD 09/13/13 [phone]

## 2013-09-12 NOTE — Telephone Encounter (Signed)
Per Dr Arbutus Ped i told Thomas May to tell pt to hold Tarceva for now. She mentioned he took something for constipation 2 days ago.

## 2013-09-13 ENCOUNTER — Ambulatory Visit
Admission: RE | Admit: 2013-09-13 | Discharge: 2013-09-13 | Disposition: A | Discharge status: Home / Self Care | Payer: Medicare Other | Source: Ambulatory Visit | Attending: Radiation Oncology | Admitting: Radiation Oncology

## 2013-09-13 DIAGNOSIS — C3491 Malignant neoplasm of unspecified part of right bronchus or lung: Secondary | ICD-10-CM

## 2013-09-13 NOTE — Progress Notes (Signed)
  Radiation Oncology         [phone]) [phone] ________________________________  Name: Thomas May MRN: [id number]  Date: 09/13/2013  DOB: [birth year]  Simulation Verification Note  Status: outpatient  NARRATIVE: The patient was brought to the treatment unit and placed in the planned treatment position. The clinical setup was verified. Then port films were obtained and uploaded to the radiation oncology medical record software.  The treatment beams were carefully compared against the planned radiation fields. The position location and shape of the radiation fields was reviewed. The targeted volume of tissue appears appropriately covered by the radiation beams. Organs at risk appear to be excluded as planned.  Based on my personal review, I approved the simulation verification. The patient's treatment will proceed as planned.  ------------------------------------------------  Lurline Hare, MD

## 2013-09-14 ENCOUNTER — Ambulatory Visit
Admission: RE | Admit: 2013-09-14 | Discharge: 2013-09-14 | Disposition: A | Discharge status: Home / Self Care | Payer: Medicare Other | Source: Ambulatory Visit | Attending: Radiation Oncology | Admitting: Radiation Oncology

## 2013-09-14 ENCOUNTER — Telehealth: Payer: Self-pay | Admitting: Medical Oncology

## 2013-09-14 NOTE — Telephone Encounter (Signed)
F?u call to see how pt doing. HE has not had any diarrhea today . ( Daughter stated she found out that he took enema and laxative 2 days for constipation prior to going to ED. Pt BETTER today . I told her per Dr Arbutus Ped that he can restart tarceva and to call back if diarrhea starts back.

## 2013-09-17 ENCOUNTER — Ambulatory Visit
Admission: RE | Admit: 2013-09-17 | Discharge: 2013-09-17 | Disposition: A | Discharge status: Home / Self Care | Payer: Medicare Other | Source: Ambulatory Visit | Attending: Radiation Oncology | Admitting: Radiation Oncology

## 2013-09-18 ENCOUNTER — Ambulatory Visit
Admission: RE | Admit: 2013-09-18 | Discharge: 2013-09-18 | Disposition: A | Discharge status: Home / Self Care | Payer: Medicare Other | Source: Ambulatory Visit | Attending: Radiation Oncology | Admitting: Radiation Oncology

## 2013-09-19 ENCOUNTER — Ambulatory Visit
Admission: RE | Admit: 2013-09-19 | Discharge: 2013-09-19 | Disposition: A | Discharge status: Home / Self Care | Payer: Medicare Other | Source: Ambulatory Visit | Attending: Radiation Oncology | Admitting: Radiation Oncology

## 2013-09-19 ENCOUNTER — Encounter: Payer: Self-pay | Admitting: Radiation Oncology

## 2013-09-19 VITALS — BP 126/92 | HR 104 | Temp 98.3°F | Resp 20 | Wt 117.0 lb

## 2013-09-19 DIAGNOSIS — C3491 Malignant neoplasm of unspecified part of right bronchus or lung: Secondary | ICD-10-CM

## 2013-09-19 NOTE — Progress Notes (Signed)
Weekly Management Note Current Dose:15 Gy  Projected Dose:30 Gy   Narrative:  The patient presents for routine under treatment assessment.  CBCT/MVCT images/Port film x-rays were reviewed.  The chart was checked. Diarrhea resolved and then restarted today when he started Tarceva again. Eating mild diet. Taking liquid immodium. No appetite. Afraid to eat due to diarrhea. Wife is pushing food. He has new left hip pain (similar to preRT) No sore throat. Only taking BID long acting pain medications.   Physical Findings:  Cachetic in a wheelchair.   Vitals:  Filed Vitals:   09/19/13 1836  BP: 126/92  Pulse: 104  Temp: 98.3 F (36.8 C)  Resp: 20   Weight:  Wt Readings from Last 3 Encounters:  09/19/13 117 lb (53.071 kg)  08/31/13 128 lb (58.06 kg)  08/23/13 122 lb 9.6 oz (55.611 kg)   Lab Results  Component Value Date   WBC 11.3* 09/12/2013   HGB 12.4* 09/12/2013   HCT 35.3* 09/12/2013   MCV 84.7 09/12/2013   PLT 274 09/12/2013   Lab Results  Component Value Date   CREATININE 0.90 09/12/2013   BUN 20 09/12/2013   NA 132* 09/12/2013   K 3.4* 09/12/2013   CL 98 09/12/2013   CO2 23 09/12/2013     Impression:  The patient is tolerating radiation.  Plan:  Continue treatment as planned. Continue immodium. Push fluids. Will tentatively schedule for fluids tomorrow. D/c tarceva for now. Consider lomotil if diarrhea does not improve.

## 2013-09-19 NOTE — Progress Notes (Signed)
Pt accompanied by family member and interpreter. Pt c/o left lower back pain x 2 days, 3/10 on pain scale. He is taking Oxycodone every 12 hours with some relief of this pain. He denies pain in his neck. Pt also developed diarrhea again today. He was seen in ED recently for diarrhea, was instructed by Dr Arbutus Ped to stop Tarceva. Diarrhea resolved so pt took Tarceva yesterday; diarrhea returned today x 3. He is taking Imodium once daily. Discussed Imodium schedule w//family member. Pt fatigued with loss of appetite.

## 2013-09-20 ENCOUNTER — Ambulatory Visit
Admission: RE | Admit: 2013-09-20 | Discharge: 2013-09-20 | Disposition: A | Discharge status: Home / Self Care | Payer: Medicare Other | Source: Ambulatory Visit | Attending: Radiation Oncology | Admitting: Radiation Oncology

## 2013-09-20 ENCOUNTER — Ambulatory Visit (HOSPITAL_BASED_OUTPATIENT_CLINIC_OR_DEPARTMENT_OTHER): Payer: Medicare Other

## 2013-09-20 ENCOUNTER — Encounter (INDEPENDENT_AMBULATORY_CARE_PROVIDER_SITE_OTHER): Payer: Self-pay

## 2013-09-20 VITALS — BP 119/80 | HR 87 | Temp 97.4°F

## 2013-09-20 DIAGNOSIS — C3491 Malignant neoplasm of unspecified part of right bronchus or lung: Secondary | ICD-10-CM

## 2013-09-20 DIAGNOSIS — C3430 Malignant neoplasm of lower lobe, unspecified bronchus or lung: Secondary | ICD-10-CM

## 2013-09-20 DIAGNOSIS — C801 Malignant (primary) neoplasm, unspecified: Secondary | ICD-10-CM | POA: Insufficient documentation

## 2013-09-20 DIAGNOSIS — R5381 Other malaise: Secondary | ICD-10-CM

## 2013-09-20 DIAGNOSIS — R197 Diarrhea, unspecified: Secondary | ICD-10-CM

## 2013-09-20 DIAGNOSIS — C7951 Secondary malignant neoplasm of bone: Secondary | ICD-10-CM

## 2013-09-20 DIAGNOSIS — C3490 Malignant neoplasm of unspecified part of unspecified bronchus or lung: Secondary | ICD-10-CM | POA: Insufficient documentation

## 2013-09-20 LAB — CBC WITH DIFFERENTIAL/PLATELET
Basophils Absolute: 0 10*3/uL (ref 0.0–0.1)
EOS%: 1.6 % (ref 0.0–7.0)
HCT: 35.9 % — ABNORMAL LOW (ref 38.4–49.9)
HGB: 11.9 g/dL — ABNORMAL LOW (ref 13.0–17.1)
LYMPH%: 18.7 % (ref 14.0–49.0)
MCH: 29.2 pg (ref 27.2–33.4)
MCV: 88 fL (ref 79.3–98.0)
MONO%: 8.2 % (ref 0.0–14.0)
RBC: 4.08 10*6/uL — ABNORMAL LOW (ref 4.20–5.82)
RDW: 15.8 % — ABNORMAL HIGH (ref 11.0–14.6)

## 2013-09-20 LAB — COMPREHENSIVE METABOLIC PANEL (CC13)
ALT: 32 U/L (ref 0–55)
AST: 27 U/L (ref 5–34)
Albumin: 2.7 g/dL — ABNORMAL LOW (ref 3.5–5.0)
Alkaline Phosphatase: 126 U/L (ref 40–150)
BUN: 13.5 mg/dL (ref 7.0–26.0)
Creatinine: 0.8 mg/dL (ref 0.7–1.3)
Potassium: 3.5 mEq/L (ref 3.5–5.1)
Sodium: 139 mEq/L (ref 136–145)
Total Bilirubin: 0.88 mg/dL (ref 0.20–1.20)

## 2013-09-20 MED ORDER — SODIUM CHLORIDE 0.9 % IV SOLN
INTRAVENOUS | Status: DC
  Administered 2013-09-20: 17:00:00 via INTRAVENOUS

## 2013-09-20 NOTE — Patient Instructions (Signed)
Dehydration, Adult Dehydration is when you lose more fluids from the body than you take in. Vital organs like the kidneys, brain, and heart cannot function without a proper amount of fluids and salt. Any loss of fluids from the body can cause dehydration.  CAUSES   Vomiting.  Diarrhea.  Excessive sweating.  Excessive urine output.  Fever. SYMPTOMS  Mild dehydration  Thirst.  Dry lips.  Slightly dry mouth. Moderate dehydration  Very dry mouth.  Sunken eyes.  Skin does not bounce back quickly when lightly pinched and released.  Dark urine and decreased urine production.  Decreased tear production.  Headache. Severe dehydration  Very dry mouth.  Extreme thirst.  Rapid, weak pulse (more than 100 beats per minute at rest).  Cold hands and feet.  Not able to sweat in spite of heat and temperature.  Rapid breathing.  Blue lips.  Confusion and lethargy.  Difficulty being awakened.  Minimal urine production.  No tears. DIAGNOSIS  Your caregiver will diagnose dehydration based on your symptoms and your exam. Blood and urine tests will help confirm the diagnosis. The diagnostic evaluation should also identify the cause of dehydration. TREATMENT  Treatment of mild or moderate dehydration can often be done at home by increasing the amount of fluids that you drink. It is best to drink small amounts of fluid more often. Drinking too much at one time can make vomiting worse. Refer to the home care instructions below. Severe dehydration needs to be treated at the hospital where you will probably be given intravenous (IV) fluids that contain water and electrolytes. HOME CARE INSTRUCTIONS   Ask your caregiver about specific rehydration instructions.  Drink enough fluids to keep your urine clear or pale yellow.  Drink small amounts frequently if you have nausea and vomiting.  Eat as you normally do.  Avoid:  Foods or drinks high in sugar.  Carbonated  drinks.  Juice.  Extremely hot or cold fluids.  Drinks with caffeine.  Fatty, greasy foods.  Alcohol.  Tobacco.  Overeating.  Gelatin desserts.  Wash your hands well to avoid spreading bacteria and viruses.  Only take over-the-counter or prescription medicines for pain, discomfort, or fever as directed by your caregiver.  Ask your caregiver if you should continue all prescribed and over-the-counter medicines.  Keep all follow-up appointments with your caregiver. SEEK MEDICAL CARE IF:  You have abdominal pain and it increases or stays in one area (localizes).  You have a rash, stiff neck, or severe headache.  You are irritable, sleepy, or difficult to awaken.  You are weak, dizzy, or extremely thirsty. SEEK IMMEDIATE MEDICAL CARE IF:   You are unable to keep fluids down or you get worse despite treatment.  You have frequent episodes of vomiting or diarrhea.  You have blood or green matter (bile) in your vomit.  You have blood in your stool or your stool looks black and tarry.  You have not urinated in 6 to 8 hours, or you have only urinated a small amount of very dark urine.  You have a fever.  You faint. MAKE SURE YOU:   Understand these instructions.  Will watch your condition.  Will get help right away if you are not doing well or get worse. Document Released: 09/27/2005 Document Revised: 12/20/2011 Document Reviewed: 05/17/2011 [employer] Patient Information 2014 [employer].  

## 2013-09-21 ENCOUNTER — Ambulatory Visit
Admission: RE | Admit: 2013-09-21 | Discharge: 2013-09-21 | Disposition: A | Discharge status: Home / Self Care | Payer: Medicare Other | Source: Ambulatory Visit | Attending: Radiation Oncology | Admitting: Radiation Oncology

## 2013-09-24 ENCOUNTER — Ambulatory Visit
Admission: RE | Admit: 2013-09-24 | Discharge: 2013-09-24 | Disposition: A | Discharge status: Home / Self Care | Payer: Medicare Other | Source: Ambulatory Visit | Attending: Radiation Oncology | Admitting: Radiation Oncology

## 2013-09-25 ENCOUNTER — Ambulatory Visit (HOSPITAL_COMMUNITY)
Admission: RE | Admit: 2013-09-25 | Discharge: 2013-09-25 | Disposition: A | Discharge status: Home / Self Care | Payer: Medicare Other | Source: Ambulatory Visit | Attending: Internal Medicine | Admitting: Internal Medicine

## 2013-09-25 ENCOUNTER — Encounter: Payer: Self-pay | Admitting: Radiation Oncology

## 2013-09-25 ENCOUNTER — Ambulatory Visit
Admission: RE | Admit: 2013-09-25 | Discharge: 2013-09-25 | Disposition: A | Discharge status: Home / Self Care | Payer: Medicare Other | Source: Ambulatory Visit | Attending: Radiation Oncology | Admitting: Radiation Oncology

## 2013-09-25 ENCOUNTER — Other Ambulatory Visit (HOSPITAL_BASED_OUTPATIENT_CLINIC_OR_DEPARTMENT_OTHER): Payer: Medicare Other

## 2013-09-25 ENCOUNTER — Encounter (HOSPITAL_COMMUNITY): Payer: Self-pay

## 2013-09-25 VITALS — BP 106/70 | HR 71 | Temp 98.5°F | Resp 16 | Wt 124.7 lb

## 2013-09-25 DIAGNOSIS — C3491 Malignant neoplasm of unspecified part of right bronchus or lung: Secondary | ICD-10-CM

## 2013-09-25 DIAGNOSIS — N320 Bladder-neck obstruction: Secondary | ICD-10-CM | POA: Insufficient documentation

## 2013-09-25 DIAGNOSIS — R918 Other nonspecific abnormal finding of lung field: Secondary | ICD-10-CM | POA: Insufficient documentation

## 2013-09-25 DIAGNOSIS — K838 Other specified diseases of biliary tract: Secondary | ICD-10-CM | POA: Insufficient documentation

## 2013-09-25 DIAGNOSIS — C7951 Secondary malignant neoplasm of bone: Secondary | ICD-10-CM | POA: Insufficient documentation

## 2013-09-25 DIAGNOSIS — K7689 Other specified diseases of liver: Secondary | ICD-10-CM | POA: Insufficient documentation

## 2013-09-25 DIAGNOSIS — N200 Calculus of kidney: Secondary | ICD-10-CM | POA: Insufficient documentation

## 2013-09-25 DIAGNOSIS — C3490 Malignant neoplasm of unspecified part of unspecified bronchus or lung: Secondary | ICD-10-CM | POA: Insufficient documentation

## 2013-09-25 DIAGNOSIS — J90 Pleural effusion, not elsewhere classified: Secondary | ICD-10-CM | POA: Insufficient documentation

## 2013-09-25 DIAGNOSIS — N210 Calculus in bladder: Secondary | ICD-10-CM | POA: Insufficient documentation

## 2013-09-25 DIAGNOSIS — N138 Other obstructive and reflux uropathy: Secondary | ICD-10-CM | POA: Insufficient documentation

## 2013-09-25 DIAGNOSIS — C3430 Malignant neoplasm of lower lobe, unspecified bronchus or lung: Secondary | ICD-10-CM

## 2013-09-25 DIAGNOSIS — N401 Enlarged prostate with lower urinary tract symptoms: Secondary | ICD-10-CM | POA: Insufficient documentation

## 2013-09-25 DIAGNOSIS — R599 Enlarged lymph nodes, unspecified: Secondary | ICD-10-CM | POA: Insufficient documentation

## 2013-09-25 DIAGNOSIS — M439 Deforming dorsopathy, unspecified: Secondary | ICD-10-CM | POA: Insufficient documentation

## 2013-09-25 DIAGNOSIS — I2510 Atherosclerotic heart disease of native coronary artery without angina pectoris: Secondary | ICD-10-CM | POA: Insufficient documentation

## 2013-09-25 LAB — COMPREHENSIVE METABOLIC PANEL (CC13)
Albumin: 2.5 g/dL — ABNORMAL LOW (ref 3.5–5.0)
Alkaline Phosphatase: 123 U/L (ref 40–150)
Anion Gap: 9 mEq/L (ref 3–11)
BUN: 8.7 mg/dL (ref 7.0–26.0)
CO2: 25 mEq/L (ref 22–29)
Glucose: 125 mg/dl (ref 70–140)
Sodium: 139 mEq/L (ref 136–145)
Total Bilirubin: 0.47 mg/dL (ref 0.20–1.20)

## 2013-09-25 LAB — CBC WITH DIFFERENTIAL/PLATELET
BASO%: 0.2 % (ref 0.0–2.0)
Basophils Absolute: 0 10*3/uL (ref 0.0–0.1)
Eosinophils Absolute: 0.1 10*3/uL (ref 0.0–0.5)
HCT: 32.5 % — ABNORMAL LOW (ref 38.4–49.9)
HGB: 10.9 g/dL — ABNORMAL LOW (ref 13.0–17.1)
LYMPH%: 13.4 % — ABNORMAL LOW (ref 14.0–49.0)
MCV: 88.4 fL (ref 79.3–98.0)
MONO#: 0.6 10*3/uL (ref 0.1–0.9)
MONO%: 6.2 % (ref 0.0–14.0)
NEUT#: 7.7 10*3/uL — ABNORMAL HIGH (ref 1.5–6.5)
Platelets: 308 10*3/uL (ref 140–400)

## 2013-09-25 MED ORDER — IOHEXOL 300 MG/ML  SOLN
80.0000 mL | Freq: Once | INTRAMUSCULAR | Status: AC | PRN
  Administered 2013-09-25: 80 mL via INTRAVENOUS

## 2013-09-25 NOTE — Progress Notes (Signed)
Weekly Management Note Current Dose:27 Gy  Projected Dose:30 Gy   Narrative:  The patient presents for routine under treatment assessment.  CBCT/MVCT images/Port film x-rays were reviewed.  The chart was checked. No neck pain. Stronger. Walking up and down stairs. Diarrhea has resolved (stopped Tarceva). Scans today with results pending. No mucocitis. Eating well.   Physical Findings:  Unchanged  Vitals:  Filed Vitals:   09/25/13 1056  BP: 106/70  Pulse: 71  Temp: 98.5 F (36.9 C)  Resp: 16   Weight:  Wt Readings from Last 3 Encounters:  09/25/13 124 lb 11.2 oz (56.564 kg)  09/19/13 117 lb (53.071 kg)  08/31/13 128 lb (58.06 kg)   Lab Results  Component Value Date   WBC 9.7 09/25/2013   HGB 10.9* 09/25/2013   HCT 32.5* 09/25/2013   MCV 88.4 09/25/2013   PLT 308 09/25/2013   Lab Results  Component Value Date   CREATININE 0.8 09/25/2013   BUN 8.7 09/25/2013   NA 139 09/25/2013   K 3.4* 09/25/2013   CL 98 09/12/2013   CO2 25 09/25/2013     Impression:  The patient is tolerating radiation.  Plan:  Continue treatment as planned. Follow up in 1 month. Follow with med onc on Thursday to discuss scans.   CT shows progression at T12 with encroachment on cord. We can consider RT to this area. Will discuss tomorrow.

## 2013-09-25 NOTE — Progress Notes (Addendum)
Weekly rad txs L3-S2 spine  C1-C4 1 more treatment ,poor appetite, has not taken Tarceva past 4 days, s/p diarrhea stated, has taken Imodium ad and has resolved, no difficulty swallowing,no sore throat,  Vitals wnl, 100% room air sats, no c/o nausea, is weak, had labs today results in and CT chest is pending still, denies pain at present, has gained 7 lbs since 09/20/13 wife making patient eat  11:00 AM

## 2013-09-26 ENCOUNTER — Ambulatory Visit
Admission: RE | Admit: 2013-09-26 | Discharge: 2013-09-26 | Disposition: A | Discharge status: Home / Self Care | Payer: Medicare Other | Source: Ambulatory Visit | Attending: Radiation Oncology | Admitting: Radiation Oncology

## 2013-09-26 ENCOUNTER — Encounter: Payer: Self-pay | Admitting: Radiation Oncology

## 2013-09-26 NOTE — Progress Notes (Signed)
  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   He had remarkably no dysphagia or odynophagia. He did have some diarrhea which resolved after he stopped taking his Tarceva.  Plan: The patient has completed radiation treatment. The patient will return to radiation oncology clinic for routine followup in one month. I advised them to call or return sooner if they have any questions or concerns related to their recovery or treatment.  ------------------------------------------------  Lurline Hare, MD

## 2013-09-27 ENCOUNTER — Ambulatory Visit
Admission: RE | Admit: 2013-09-27 | Discharge: 2013-09-27 | Disposition: A | Discharge status: Home / Self Care | Payer: Medicare Other | Source: Ambulatory Visit | Attending: Radiation Oncology | Admitting: Radiation Oncology

## 2013-09-27 ENCOUNTER — Encounter (INDEPENDENT_AMBULATORY_CARE_PROVIDER_SITE_OTHER): Payer: Self-pay

## 2013-09-27 ENCOUNTER — Ambulatory Visit (HOSPITAL_BASED_OUTPATIENT_CLINIC_OR_DEPARTMENT_OTHER): Payer: Medicare Other | Admitting: Internal Medicine

## 2013-09-27 ENCOUNTER — Telehealth: Payer: Self-pay | Admitting: Internal Medicine

## 2013-09-27 VITALS — BP 103/69 | HR 91 | Temp 99.1°F | Resp 18 | Ht 66.0 in | Wt 123.9 lb

## 2013-09-27 DIAGNOSIS — C787 Secondary malignant neoplasm of liver and intrahepatic bile duct: Secondary | ICD-10-CM

## 2013-09-27 DIAGNOSIS — C3491 Malignant neoplasm of unspecified part of right bronchus or lung: Secondary | ICD-10-CM

## 2013-09-27 DIAGNOSIS — C3490 Malignant neoplasm of unspecified part of unspecified bronchus or lung: Secondary | ICD-10-CM

## 2013-09-27 DIAGNOSIS — C7951 Secondary malignant neoplasm of bone: Secondary | ICD-10-CM

## 2013-09-27 MED ORDER — DEXAMETHASONE 4 MG PO TABS: 4.0000 mg | ORAL_TABLET | Freq: Four times a day (QID) | ORAL | Status: AC

## 2013-09-27 NOTE — Progress Notes (Signed)
[facility] [phone]) [phone]   [phone]) [phone]  SHARED VISIT PROGRESS NOTE  Piedad Climes, PA-C  [address] [place] [address]  DIAGNOSIS: Metastatic non-small cell lung cancer favoring adenocarcinoma with negative EGFR mutation, negative ALK gene translocation and negative ROS1 diagnosed in August of 2013. Veristrat test Good.  PRIOR THERAPY:  1) Status post 4 cycles of systemic chemotherapy with carboplatin and Alimta with partial response at [facility]. Last dose was given in November of 2013.  2) Maintenance chemotherapy with Alimta 500 mg/M2 started on 09/26/2012, status post 9 cycles. Last dose was given on 06/05/2013. Discontinued secondary to disease progression. 3) Palliative radiotherapy to the lower thoracic and lumbar spine under the care of Dr. Michell Heinrich, completed 07/19/2013. 4) Tarceva 150 mg by mouth daily. Status post approximately 2 months of therapy, discontinued today secondary to disease progression.  CURRENT THERAPY:  Docetaxel 75 mg/M2 every 3 weeks with Neulasta support. First cycle expected on 10/15/2013   DISEASE STAGE: Stage IV  CHEMOTHERAPY INTENT: Palliative  CURRENT # OF CHEMOTHERAPY CYCLES: 1 CURRENT ANTIEMETICS: Zofran and Compazine  CURRENT SMOKING STATUS: Former smoker, quit 05/11/2012  ORAL CHEMOTHERAPY AND CONSENT: Tarceva and consent was signed today. CURRENT BISPHOSPHONATES USE: None  PAIN MANAGEMENT: 4/10  NARCOTICS INDUCED CONSTIPATION: None  LIVING WILL AND CODE STATUS: ?   INTERVAL HISTORY: Sebasthian Bain 67 y.o. male returns to the clinic today for followup visit accompanied by his son and his [place] interpreter. The patient tolerated his treatment with Tarceva fairly well with no significant adverse effects except for the mild skin rash on the face. He denied having any significant diarrhea but actually has constipation for almost a week. He used MiraLAX with no significant improvement.  He denied having any significant urine or bowel incontinence. He has no significant weakness in the lower extremities. He denied having any significant chest pain, shortness of breath, cough or hemoptysis. The patient denied having any significant weight loss or night sweats. The patient has no nausea or vomiting. He had repeat CT scan of the chest, abdomen and pelvis performed recently and he is here for evaluation and discussion of his scan results and recommendation regarding treatment of his condition.  MEDICAL HISTORY: Past Medical History  Diagnosis Date  . Tobacco use   . Lung mass   . BPH (benign prostatic hyperplasia)   . Lung cancer   . Coronary artery disease   . Radiation 07/04/13-07/19/13    Lumbosacral spine 30 Gy    ALLERGIES:  has No Known Allergies.  MEDICATIONS:  Current Outpatient Prescriptions  Medication Sig Dispense Refill  . clindamycin (CLINDAGEL) 1 % gel APPLY TO THE AFFECTED AREA TWICE DAILY TOPICALLY  30 g  0  . erlotinib (TARCEVA) 150 MG tablet Take 1 tablet (150 mg total) by mouth daily. Take on an empty stomach 1 hour before meals or 2 hours after.  30 tablet  2  . loperamide (IMODIUM) 2 MG capsule Take by mouth as needed for diarrhea or loose stools.      . Multiple Vitamin (MULTIVITAMIN) tablet Take 1 tablet by mouth daily.      . OxyCODONE (OXYCONTIN) 20 mg T12A 12 hr tablet Take 1 tablet (20 mg total) by mouth every 12 (twelve) hours.  60 tablet  0  . oxyCODONE-acetaminophen (PERCOCET/ROXICET) 5-325 MG per tablet Take 1 tablet by mouth every 6 (six) hours as needed. May take 2 tablets PO q 6 hours for severe  pain - Do not take with Tylenol as this tablet already contains tylenol  60 tablet  0  . Saw Palmetto, Serenoa repens, (SAW PALMETTO PO) Take 1 capsule by mouth daily.      Marland Kitchen terazosin (HYTRIN) 2 MG capsule TAKE 1 CAPSULE BY MOUTH AT BEDTIME  30 capsule  2   No current facility-administered medications for this visit.    SURGICAL HISTORY:  Past  Surgical History  Procedure Laterality Date  . Video bronchoscopy  05/31/2012    Procedure: VIDEO BRONCHOSCOPY WITH FLUORO;  Surgeon: Nyoka Cowden, MD;  Location: [facility];  Service: Endoscopy;  Laterality: Bilateral;    REVIEW OF SYSTEMS:  Constitutional: positive for anorexia, fatigue and weight loss Eyes: negative Ears, nose, mouth, throat, and face: negative Respiratory: negative Cardiovascular: negative Gastrointestinal: positive for constipation Genitourinary:negative Integument/breast: negative Hematologic/lymphatic: negative Musculoskeletal:positive for bone pain, muscle weakness and improved Neurological: positive for increased confusion Behavioral/Psych: negative Endocrine: negative Allergic/Immunologic: negative   PHYSICAL EXAMINATION: General appearance: alert, cooperative, fatigued and no distress Head: Normocephalic, without obvious abnormality, atraumatic Neck: no adenopathy, no JVD, supple, symmetrical, trachea midline and thyroid not enlarged, symmetric, no tenderness/mass/nodules Lymph nodes: Cervical, supraclavicular, and axillary nodes normal. Resp: clear to auscultation bilaterally Back: symmetric, no curvature. ROM normal. No CVA tenderness. Cardio: regular rate and rhythm, S1, S2 normal, no murmur, click, rub or gallop GI: soft, non-tender; bowel sounds normal; no masses,  no organomegaly Extremities: extremities normal, atraumatic, no cyanosis or edema Neurologic: Alert and oriented X 3, normal strength and tone. Normal symmetric reflexes. Normal coordination and gait  ECOG PERFORMANCE STATUS: 2 - Symptomatic, <50% confined to bed  Blood pressure 103/69, pulse 91, temperature 99.1 F (37.3 C), temperature source Oral, resp. rate 18, height 5\' 6"  (1.676 m), weight 123 lb 14.4 oz (56.201 kg).  LABORATORY DATA: Lab Results  Component Value Date   WBC 9.7 09/25/2013   HGB 10.9* 09/25/2013   HCT 32.5* 09/25/2013   MCV 88.4 09/25/2013   PLT 308  09/25/2013      Chemistry      Component Value Date/Time   NA 139 09/25/2013 0913   NA 132* 09/12/2013 1913   K 3.4* 09/25/2013 0913   K 3.4* 09/12/2013 1913   CL 98 09/12/2013 1913   CL 109* 04/03/2013 1155   CO2 25 09/25/2013 0913   CO2 23 09/12/2013 1913   BUN 8.7 09/25/2013 0913   BUN 20 09/12/2013 1913   CREATININE 0.8 09/25/2013 0913   CREATININE 0.90 09/12/2013 1913   CREATININE 0.65 06/02/2012 1611      Component Value Date/Time   CALCIUM 9.4 09/25/2013 0913   CALCIUM 9.9 09/12/2013 1913   ALKPHOS 123 09/25/2013 0913   ALKPHOS 110 06/18/2013 1712   AST 27 09/25/2013 0913   AST 34 06/18/2013 1712   ALT 35 09/25/2013 0913   ALT 38 06/18/2013 1712   BILITOT 0.47 09/25/2013 0913   BILITOT 0.6 06/18/2013 1712       RADIOGRAPHIC STUDIES: Ct Chest W Contrast  09/25/2013   CLINICAL DATA:  Right-sided lung cancer with metastasis.  EXAM: CT CHEST, ABDOMEN, AND PELVIS WITH CONTRAST  TECHNIQUE: Multidetector CT imaging of the chest, abdomen and pelvis was performed following the standard protocol during bolus administration of intravenous contrast.  CONTRAST:  80mL OMNIPAQUE IOHEXOL 300 MG/ML  SOLN  COMPARISON:  Chest CT 06/22/2013. Abdominal pelvic CT of 06/18/2013.  FINDINGS:   CT CHEST FINDINGS  Lungs/Pleura: Mild motion degradation.  Enlargement of a right  lower lobe lung nodule. 1.8 cm on image 25 versus 1.2 cm on the prior.  New right lower lobe nodule at 1.2 cm on image 33. Surrounding smaller nodules also identified.  Right middle lobe lung mass which is progressive. 4.3 by 3.4 cm on image 31. 3.3 x 2.0 cm at the same level on the prior.  There are other scattered pulmonary nodules, including at the left base.  Patchy areas of interstitial thickening which are non specific. These are new.  Small right-sided pleural effusion. Trace left pleural thickening which is similar.  Heart/Mediastinum: No supraclavicular adenopathy. A right-sided Port-A-Cath which terminates at the mid right atrium  on this supine exam.  Tortuous thoracic aorta. Mild to moderate cardiomegaly. Coronary artery atherosclerosis. No central pulmonary embolism, on this non-dedicated study.  Development of right hilar adenopathy at 2.6 cm on image 22. Adenopathy is also seen along the right infrahilar interstitium on image 28. New left infrahilar adenopathy at 1.4 cm on image 26.    CT ABDOMEN AND PELVIS FINDINGS  Abdomen/Pelvis: Right hepatic lobe 1.0 cm lesion on image 43/series 2 which is similar in size back to 12/18/2012. Favored to represent a cyst.  Within the posterior segment right liver lobe on image 43/ series 2, an 8 mm lesion is likely new since the prior and suspicious for metastatic disease. Normal spleen, stomach, pancreas. Motion degradation within the abdomen. Normal gallbladder. Intrahepatic biliary ductal dilatation is slightly increased. The common duct measures 7 mm on coronal image 33, upper normal for age. Equivocal soft tissue density in the region of the distal common duct on image 57/series 2.  Prominent porta hepatis node at 1.3 cm on image 55. New since 12/18/2012.  Normal adrenal glands. Left renal collecting system calculus. Too small to characterize lesions in both kidneys. No retroperitoneal or retrocrural adenopathy. Colonic stool burden suggests constipation. Normal terminal ileum and appendix.  Normal small bowel without abdominal ascites. No pelvic adenopathy.  Moderate prostatomegaly. Bladder outlet obstruction, as evidenced by bladder diverticula and saccules. Stones within a left-sided bladder diverticulum measure up to 1.5 cm. There are also stones within the dependent urinary bladder. No significant free fluid.  Bones/Musculoskeletal: Extensive osseous metastasis. Since 06/18/2013, progressive. Example increased right iliac wing osseous destruction on image 96. Left-sided L5 lytic lesion on image 85. L4 lesion demonstrates encroachment of the right side of the canal on image 79. Left-sided  posterior element lesion at T12 has a soft tissue component and causes mass effect upon the canal. Example image 55. This is increased since 06/22/2013. Destructive lesions within the 1st and 3rd left ribs are progressive.  Development of L5 compression deformity with persistent L4 compression deformity. Mild L2 compression deformity is similar. T12 compression deformity is progressive.    IMPRESSION: CT CHEST IMPRESSION  1. Progression of disease, including enlargement of pulmonary nodules and right middle lobe mass, developing thoracic adenopathy, and progressive osseous metastasis. 2. Small right pleural effusion is new. 3. Nonspecific patchy interstitial opacities within the lungs. Findings could relate to a component of fluid overload or even lymphangitic tumor spread.    CT ABDOMEN AND PELVIS IMPRESSION  1. Progression of osseous metastasis with increase an canal encroachment since 06/18/2013. Thoracic and lumbar spine pre and post contrast repeat MRI may be informative. 2. New posterior right liver lobe lesion, suspicious for early hepatic metastasis. 3. Increase in moderate intrahepatic ductal dilatation, without definite cause. Difficult to exclude choledocholithiasis, with suggestion of soft tissue density in the distal common duct. Alternatively,  developing adenopathy in the porta hepatis may be causing obstruction. Correlate with bilirubin levels. If concern of choledocholithiasis, consider MRCP. 4. Left nephrolithiasis. 5. Motion degraded exam throughout. 6. Bladder outlet obstruction with extensive bladder calculi.   Electronically Signed   By: Jeronimo Greaves M.D.   On: 09/25/2013 12:33    ASSESSMENT AND PLAN: This is a very pleasant 67 years old [place] male with metastatic non-small cell lung cancer, adenocarcinoma status post systemic chemotherapy with carboplatin and Alimta followed by 9 cycles of maintenance chemotherapy with single agent Alimta but unfortunately has evidence for disease  progression with multiple bone metastases as well as progression of the disease in his lung. The patient has completed palliative radiotherapy under the care of Dr. Michell Heinrich. His Veristrat test was good. He he was started on treatment with Tarceva for the last 2 months but unfortunately has evidence for disease progression with new lesions in the liver as well as progression of the lung nodules and mediastinal lymph nodes..  We will discontinue his current treatment with Tarceva at this point secondary to disease progression. I have a lengthy discussion with the patient and his son today about his current disease condition and treatment options. I discussed with the patient and consideration of palliative care and hospice referral versus proceeding with systemic chemotherapy with single agent docetaxel 75 mg/M2 every 3 weeks with Neulasta support. The patient is interested in considering chemotherapy. I discussed with him the adverse effect of this treatment including but not limited to alopecia, myelosuppression, nausea and vomiting, peripheral neuropathy, liver or renal dysfunction.  He is expected to start the first cycle of this treatment on 10/15/2013 after completion of palliative radiotherapy to the destructive bone lesion at T12.  I will order MRI of the thoracic spine for further evaluation of these bone lesions especially with the concern of cord compression. I will start the patient on Decadron 4 mg by mouth every 6 hours and this will be tapered gradually as he received his palliative radiotherapy. The patient is scheduled to see Dr. Michell Heinrich later today for palliative radiotherapy to the T12 lesion. For constipation the patient was advised to use magnesium citrate as needed. He was advised to call immediately if he has any concerning symptoms in the interval. The patient voices understanding of current disease status and treatment options and is in agreement with the current care plan.    He would come back for follow up visit in 3 weeks for reevaluation and management any adverse effect of his treatment. All questions were answered. The patient knows to call the clinic with any problems, questions or concerns. We can certainly see the patient much sooner if necessary.  I spent 20 minutes counseling the patient face to face. The total time spent in the appointment was 30 minutes.  Lajuana Matte., MD 09/27/2013

## 2013-09-27 NOTE — Progress Notes (Signed)
Name: Thomas May   MRN: [id number]  Date:  09/27/2013  DOB: January 22, 1946  Status:outpatient    DIAGNOSIS: Metastatic Non Small Cell Lung Cancer  CONSENT VERIFIED: yes   SET UP: Patient is setup supine   IMMOBILIZATION:  The following immobilization was used: Alpha cradle  NARRATIVE:  Thomas May was brought to the CT Simulation planning suite.  Identity was confirmed.  All relevant records and images related to the planned course of therapy were reviewed.  Then, the patient was positioned in a stable reproducible clinical set-up for radiation therapy.  CT images were obtained.  An isocenter was placed. Skin markings were placed.  The CT images were loaded into the planning software where the target and avoidance structures were contoured.  The radiation prescription was entered and confirmed. The patient was discharged in stable condition and tolerated simulation well.    TREATMENT PLANNING NOTE:  Treatment planning then occurred. I have requested : MLC's, isodose plan, basic dose calculation  I have requested 3 dimensional simulation with DVH of cord, kidneys, bowel and GTV We will also ensure there is no overlap with his previously treated fields.

## 2013-09-27 NOTE — Telephone Encounter (Signed)
gv and printed appt sched and avs for pt for Jan 2015  sed added tx.

## 2013-09-29 ENCOUNTER — Encounter: Payer: Self-pay | Admitting: Internal Medicine

## 2013-09-29 NOTE — Patient Instructions (Signed)
CURRENT THERAPY: Docetaxel 75 mg/M2 every 3 weeks with Neulasta support. First cycle expected on 10/15/2013  DISEASE STAGE: Stage IV  CHEMOTHERAPY INTENT: Palliative  CURRENT # OF CHEMOTHERAPY CYCLES: 1  CURRENT ANTIEMETICS: Zofran and Compazine  CURRENT SMOKING STATUS: Former smoker, quit 05/11/2012  ORAL CHEMOTHERAPY AND CONSENT: Tarceva and consent was signed today.  CURRENT BISPHOSPHONATES USE: None  PAIN MANAGEMENT: 4/10  NARCOTICS INDUCED CONSTIPATION: None  LIVING WILL AND CODE STATUS: ?

## 2013-10-02 ENCOUNTER — Ambulatory Visit
Admission: RE | Admit: 2013-10-02 | Discharge: 2013-10-02 | Disposition: A | Discharge status: Home / Self Care | Payer: Medicare Other | Source: Ambulatory Visit | Attending: Radiation Oncology | Admitting: Radiation Oncology

## 2013-10-02 ENCOUNTER — Ambulatory Visit: Admission: RE | Admit: 2013-10-02 | Payer: Medicare Other | Source: Ambulatory Visit | Admitting: Radiation Oncology

## 2013-10-02 DIAGNOSIS — C3490 Malignant neoplasm of unspecified part of unspecified bronchus or lung: Secondary | ICD-10-CM

## 2013-10-02 NOTE — Progress Notes (Signed)
  Radiation Oncology         [phone]) [phone] ________________________________  Name: Thomas May MRN: [id number]  Date: 10/02/2013  DOB: 04-12-1946  Simulation Verification Note  Status: outpatient  NARRATIVE: The patient was brought to the treatment unit and placed in the planned treatment position. The clinical setup was verified. Then port films were obtained and uploaded to the radiation oncology medical record software.  The treatment beams were carefully compared against the planned radiation fields. The position location and shape of the radiation fields was reviewed. They targeted volume of tissue appears to be appropriately covered by the radiation beams. Organs at risk appear to be excluded as planned.  Based on my personal review, I approved the simulation verification. The patient's treatment will proceed as planned.  -----------------------------------  Billie Lade, PhD, MD

## 2013-10-03 ENCOUNTER — Telehealth: Payer: Self-pay | Admitting: *Deleted

## 2013-10-03 ENCOUNTER — Ambulatory Visit
Admission: RE | Admit: 2013-10-03 | Discharge: 2013-10-03 | Disposition: A | Discharge status: Home / Self Care | Payer: Medicare Other | Source: Ambulatory Visit | Attending: Radiation Oncology | Admitting: Radiation Oncology

## 2013-10-03 ENCOUNTER — Encounter: Payer: Self-pay | Admitting: Radiation Oncology

## 2013-10-03 ENCOUNTER — Ambulatory Visit (HOSPITAL_COMMUNITY)
Admission: RE | Admit: 2013-10-03 | Discharge: 2013-10-03 | Disposition: A | Discharge status: Home / Self Care | Payer: Medicare Other | Source: Ambulatory Visit | Attending: Internal Medicine | Admitting: Internal Medicine

## 2013-10-03 VITALS — BP 118/75 | HR 76 | Resp 16 | Wt 115.2 lb

## 2013-10-03 DIAGNOSIS — C3490 Malignant neoplasm of unspecified part of unspecified bronchus or lung: Secondary | ICD-10-CM | POA: Insufficient documentation

## 2013-10-03 DIAGNOSIS — C7951 Secondary malignant neoplasm of bone: Secondary | ICD-10-CM | POA: Insufficient documentation

## 2013-10-03 DIAGNOSIS — J90 Pleural effusion, not elsewhere classified: Secondary | ICD-10-CM | POA: Insufficient documentation

## 2013-10-03 DIAGNOSIS — M8448XA Pathological fracture, other site, initial encounter for fracture: Secondary | ICD-10-CM | POA: Insufficient documentation

## 2013-10-03 DIAGNOSIS — C3491 Malignant neoplasm of unspecified part of right bronchus or lung: Secondary | ICD-10-CM

## 2013-10-03 DIAGNOSIS — G992 Myelopathy in diseases classified elsewhere: Secondary | ICD-10-CM | POA: Insufficient documentation

## 2013-10-03 MED ORDER — PROCHLORPERAZINE MALEATE 10 MG PO TABS: 10.0000 mg | ORAL_TABLET | Freq: Four times a day (QID) | ORAL | Status: AC | PRN

## 2013-10-03 MED ORDER — SUCRALFATE 1 GM/10ML PO SUSP: 1.0000 g | Freq: Three times a day (TID) | ORAL | Status: AC

## 2013-10-03 MED ORDER — MAGIC MOUTHWASH W/LIDOCAINE: 5.0000 mL | Freq: Three times a day (TID) | ORAL | Status: AC | PRN

## 2013-10-03 MED ORDER — GADOBENATE DIMEGLUMINE 529 MG/ML IV SOLN
10.0000 mL | Freq: Once | INTRAVENOUS | Status: AC | PRN
  Administered 2013-10-03: 10 mL via INTRAVENOUS

## 2013-10-03 NOTE — Progress Notes (Signed)
Patient denies pain at this time. Patient reports his legs feel very weak but, denies numbness or tingling. Denies incontinence of bowel or bladder. Denies nausea, vomiting, headache or dizziness. Weight loss of 9 lb noted since last weeks' PUT. Patient reports his appetite is slowly improving since being off Tarceva x2 weeks. However, patient reports difficulty swallowing related to effects of esophagitis that "make him scared to eat." Educated patient reference esophagitis management and he verbalized understanding.

## 2013-10-03 NOTE — Telephone Encounter (Signed)
CALLED CARE MANAGEMENT TO ARRANGE AN INTERP. FOR THIS PATIENT ON 10-05-13, SPOKE WITH LAWANA AND SHE SAID THAT THEY ARE CLOSED UNTIL Monday, NOTIFIED CHERYL CHESTON - AND INFORMED HER OF WHAT LAWANA SAID, SHE SAID THAT WE WOULD HAVE TO USE THE LANGUAGE LINE.

## 2013-10-03 NOTE — Progress Notes (Signed)
Weekly Management Note Current Dose:  4 Gy  Projected Dose:20  Gy   Narrative:  The patient presents for routine under treatment assessment.  CBCT/MVCT images/Port film x-rays were reviewed.  The chart was checked. Some pharyngitis (from prior cervical treamtnet) feeling overall week and doesn't want to eat. Has pain medicaitons at home. No nausea. No lower extremity weakness. MRI today.   Physical Findings: Weight: 115 lb 3.2 oz (52.254 kg). Unchanged  Impression:  The patient is tolerating radiation.  Plan:  Continue treatment as planned. Wrote scripts for MMW with lidocaine (swallor qac prn) carafate and compazine.

## 2013-10-05 ENCOUNTER — Ambulatory Visit
Admission: RE | Admit: 2013-10-05 | Discharge: 2013-10-05 | Disposition: A | Discharge status: Home / Self Care | Payer: Medicare Other | Source: Ambulatory Visit | Attending: Radiation Oncology | Admitting: Radiation Oncology

## 2013-10-08 ENCOUNTER — Telehealth: Payer: Self-pay | Admitting: *Deleted

## 2013-10-08 ENCOUNTER — Other Ambulatory Visit: Payer: Self-pay | Admitting: *Deleted

## 2013-10-08 ENCOUNTER — Ambulatory Visit
Admission: RE | Admit: 2013-10-08 | Discharge: 2013-10-08 | Disposition: A | Discharge status: Home / Self Care | Payer: Medicare Other | Source: Ambulatory Visit | Attending: Radiation Oncology | Admitting: Radiation Oncology

## 2013-10-08 DIAGNOSIS — M8430XS Stress fracture, unspecified site, sequela: Secondary | ICD-10-CM

## 2013-10-08 MED ORDER — OXYCODONE-ACETAMINOPHEN 5-325 MG PO TABS: 1.0000 | ORAL_TABLET | Freq: Four times a day (QID) | ORAL | Status: DC | PRN

## 2013-10-08 MED ORDER — OXYCODONE HCL ER 20 MG PO T12A: EXTENDED_RELEASE_TABLET | ORAL | Status: AC

## 2013-10-08 NOTE — Telephone Encounter (Signed)
Pt's daughter called worried about pt.  He is having new pain in his right lower back and buttock area.  He has been taking his percocet and OxyContin 20mg  q12h.  Per dr Donnald Garre, pt needs to inform radiation, will increase OxyContin to 20mg  q8h.  If there is any acute change, he needs to report to the ED.  PT's daughter verbalized understanding.  SLJ

## 2013-10-09 ENCOUNTER — Ambulatory Visit
Admission: RE | Admit: 2013-10-09 | Discharge: 2013-10-09 | Disposition: A | Discharge status: Home / Self Care | Payer: Medicare Other | Source: Ambulatory Visit | Attending: Radiation Oncology | Admitting: Radiation Oncology

## 2013-10-09 ENCOUNTER — Encounter: Payer: Self-pay | Admitting: Radiation Oncology

## 2013-10-09 VITALS — BP 104/70 | HR 87 | Temp 97.9°F | Resp 20

## 2013-10-09 DIAGNOSIS — R918 Other nonspecific abnormal finding of lung field: Secondary | ICD-10-CM

## 2013-10-09 MED ORDER — OXYCODONE HCL 10 MG PO TABS: 10.0000 mg | ORAL_TABLET | ORAL | Status: AC | PRN

## 2013-10-09 NOTE — Progress Notes (Signed)
Pt completed tx to T10-L1 spine. Interpreter and family member w/pt. Pt unable to stand up for weight. He c/o pain 7/10 in his lower back and legs. Family member states leg pain has steadily increased since pt began radiation. Pt taking Oxycontin, Percocet regularly for pain. He states that lying and sitting still he has no pain. Pt states throat pain improved with increase in appetite since beginning Carafate and MMW. Pt still taking Decadron 4 mg every 6 hours per family member. Pt has FU appt scheduled.

## 2013-10-09 NOTE — Progress Notes (Signed)
Weekly Management Note Current Dose:  20 Gy  Projected Dose:  20 Gy   Narrative:  The patient presents for routine under treatment assessment.  CBCT/MVCT images/Port film x-rays were reviewed.  The chart was checked. Throat pain is better. Now left leg weakenss and pain. Not controlled with current pain regimen.  No pain when lying still. Only when moving. Normal bowel and bladder.   Physical Findings: 4-/5 strength of right leg/ 5/5 on left.   Impression: completes RT today. More pain on left.  Plan:  Discussed systemic vs. Palliative treatment. We are in a "chasing our tail" situation in terms of treating his pain and delaying chemotherapy which allows the cancer to progress. Son would like to proceed with chemo as scheduled Monday. i did let him know that his declining performance status was not a good sign and that he might not be able to tolerate chemo as well. SW consult ordered.

## 2013-10-12 ENCOUNTER — Telehealth: Payer: Self-pay | Admitting: Internal Medicine

## 2013-10-12 ENCOUNTER — Other Ambulatory Visit: Payer: Self-pay | Admitting: Medical Oncology

## 2013-10-12 ENCOUNTER — Telehealth: Payer: Self-pay | Admitting: Medical Oncology

## 2013-10-12 NOTE — Telephone Encounter (Signed)
Daughter called to report that pt does not want to take chemo on Monday. He is very weak. His pain is better controlled with oxycodone but he is still very weak. Pt family is meeting this weekend and Barnett Applebaum will call back Monday with what the pt wants to do -more treatment vs hospice .

## 2013-10-12 NOTE — Telephone Encounter (Signed)
PER 1/2 POF CX'D 1/5 LB/TX AND 1/6 INJ. PER POF DTR CALLED TO CX STATES PT TO WEAK TO TAKE CHEMO

## 2013-10-12 NOTE — Progress Notes (Signed)
  Radiation Oncology         [phone]) [phone] ________________________________  Name: Kip Cropp MRN: [id number]  Date: 10/09/2013  DOB: [birth year]  End of Treatment Note  Diagnosis:   Metastatic non-small lung cancer  Indication for treatment:  Palliative       Radiation treatment dates:   10/02/2013-10/09/2013  Site/dose:   T10-L1/ 20 Gy in 5 fractions  Beams/energy:   AP/PA with 10 and 15 MV photons  Narrative: The patient tolerated radiation treatment relatively well but was experiencing more pain and a decrease in his functional status as treatment came to a close.  We discussed further RT for palliation but we have essentially been radiating him for a month, allowing the disease we are not treating to grow.  He and his son decided to proceed with chemotherapy. I will see them back for follow up in 2 weeks for consideration of palliative RT. I discussed hospice referral and his poor prognosis with them.   Plan: The patient has completed radiation treatment. The patient will return to radiation oncology clinic for routine followup in one month. I advised them to call or return sooner if they have any questions or concerns related to their recovery or treatment.  ------------------------------------------------  Thea Silversmith, MD

## 2013-10-15 ENCOUNTER — Other Ambulatory Visit: Payer: Self-pay

## 2013-10-15 ENCOUNTER — Ambulatory Visit: Payer: Self-pay

## 2013-10-15 ENCOUNTER — Telehealth: Payer: Self-pay | Admitting: Dietician

## 2013-10-15 NOTE — Telephone Encounter (Signed)
Brief Outpatient Oncology Nutrition Note  Patient has been identified to be at risk on malnutrition screen.  Wt Readings from Last 10 Encounters:  10/03/13 115 lb 3.2 oz (52.254 kg)  09/27/13 123 lb 1.6 oz (55.838 kg)  09/27/13 123 lb 14.4 oz (56.201 kg)  09/25/13 124 lb 11.2 oz (56.564 kg)  09/19/13 117 lb (53.071 kg)  08/31/13 128 lb (58.06 kg)  08/23/13 122 lb 9.6 oz (55.611 kg)  08/09/13 128 lb 14.4 oz (58.469 kg)  07/25/13 131 lb 1.6 oz (59.467 kg)  07/17/13 132 lb 9.6 oz (60.147 kg)    DIAGNOSIS: Metastatic non-small cell lung cancer favoring adenocarcinoma with negative EGFR mutation, negative ALK gene translocation and negative ROS1 diagnosed in August of 2013.  Called patient due to weight changes above. Patient is unavailable. All message boxes are full or not set up.  Outpatient [facility] is available as needed.  Antonieta Iba, RD, LDN

## 2013-10-16 ENCOUNTER — Ambulatory Visit: Payer: Self-pay

## 2013-10-17 ENCOUNTER — Telehealth: Payer: Self-pay | Admitting: *Deleted

## 2013-10-17 NOTE — Telephone Encounter (Signed)
Pt's daughter called and stated they are interested in hospice.  Hospice consult called into [facility].  SLJ

## 2013-10-22 ENCOUNTER — Other Ambulatory Visit: Payer: Self-pay

## 2013-10-22 ENCOUNTER — Ambulatory Visit: Payer: Self-pay | Admitting: Physician Assistant

## 2013-10-29 ENCOUNTER — Other Ambulatory Visit: Payer: Self-pay

## 2013-10-29 ENCOUNTER — Telehealth: Payer: Self-pay | Admitting: Medical Oncology

## 2013-10-29 NOTE — Telephone Encounter (Signed)
I returned patty's call about DNR order. Dr Julien Nordmann gave order.

## 2013-10-29 NOTE — Telephone Encounter (Signed)
Brief Outpatient Oncology Nutrition Note  Patient has been identified to be at risk on malnutrition screen.  Wt Readings from Last 10 Encounters:  10/03/13 115 lb 3.2 oz (52.254 kg)  09/27/13 123 lb 1.6 oz (55.838 kg)  09/27/13 123 lb 14.4 oz (56.201 kg)  09/25/13 124 lb 11.2 oz (56.564 kg)  09/19/13 117 lb (53.071 kg)  08/31/13 128 lb (58.06 kg)  08/23/13 122 lb 9.6 oz (55.611 kg)  08/09/13 128 lb 14.4 oz (58.469 kg)  07/25/13 131 lb 1.6 oz (59.467 kg)  07/17/13 132 lb 9.6 oz (60.147 kg)     Diagnosis:  Metastatic non-small cell lung cancer favoring adenocarcinoma with negative EGFR mutation, negative ALK gene translocation and negative ROS1 diagnosed in August of 2013.    Called and spoke with patient's son.  Son reports that father is not eating much but is drinking the Ensure.  Will mail coupons along with contact information for the [facility].  Antonieta Iba, RD, LDN

## 2013-11-01 ENCOUNTER — Ambulatory Visit: Payer: Medicare Other | Admitting: Radiation Oncology

## 2013-11-05 ENCOUNTER — Ambulatory Visit: Payer: Self-pay

## 2013-11-05 ENCOUNTER — Other Ambulatory Visit: Payer: Self-pay

## 2013-11-06 ENCOUNTER — Ambulatory Visit: Payer: Self-pay

## 2013-11-11 DEATH — deceased

## 2014-06-07 ENCOUNTER — Other Ambulatory Visit: Payer: Self-pay | Admitting: Pharmacist
